# Patient Record
Sex: Female | Born: 1970 | Race: Black or African American | Hispanic: No | Marital: Single | Smoking: Smoker, current status unknown
Health system: Southern US, Community
[De-identification: ages and names within clinical notes are randomized; demographics above are authoritative.]

## PROBLEM LIST (undated history)

## (undated) DIAGNOSIS — R51 Headache: Secondary | ICD-10-CM

## (undated) DIAGNOSIS — I10 Essential (primary) hypertension: Secondary | ICD-10-CM

## (undated) DIAGNOSIS — R519 Headache, unspecified: Secondary | ICD-10-CM

## (undated) HISTORY — PX: BRAIN SURGERY: SHX531

---

## 2015-10-06 ENCOUNTER — Inpatient Hospital Stay (HOSPITAL_COMMUNITY): Payer: Medicare Other

## 2015-10-06 ENCOUNTER — Inpatient Hospital Stay (HOSPITAL_COMMUNITY)
Admission: EM | Admit: 2015-10-06 | Discharge: 2015-10-13 | DRG: 207 | Disposition: A | Discharge status: Home / Self Care | Payer: Medicare Other | Source: Other Acute Inpatient Hospital | Attending: Internal Medicine | Admitting: Internal Medicine

## 2015-10-06 DIAGNOSIS — A047 Enterocolitis due to Clostridium difficile: Secondary | ICD-10-CM | POA: Diagnosis not present

## 2015-10-06 DIAGNOSIS — Z87820 Personal history of traumatic brain injury: Secondary | ICD-10-CM | POA: Diagnosis not present

## 2015-10-06 DIAGNOSIS — J9601 Acute respiratory failure with hypoxia: Secondary | ICD-10-CM | POA: Diagnosis present

## 2015-10-06 DIAGNOSIS — N179 Acute kidney failure, unspecified: Secondary | ICD-10-CM | POA: Diagnosis not present

## 2015-10-06 DIAGNOSIS — K7460 Unspecified cirrhosis of liver: Secondary | ICD-10-CM | POA: Diagnosis present

## 2015-10-06 DIAGNOSIS — J810 Acute pulmonary edema: Secondary | ICD-10-CM

## 2015-10-06 DIAGNOSIS — S069X9A Unspecified intracranial injury with loss of consciousness of unspecified duration, initial encounter: Secondary | ICD-10-CM | POA: Diagnosis present

## 2015-10-06 DIAGNOSIS — R109 Unspecified abdominal pain: Secondary | ICD-10-CM

## 2015-10-06 DIAGNOSIS — Z9884 Bariatric surgery status: Secondary | ICD-10-CM

## 2015-10-06 DIAGNOSIS — R4182 Altered mental status, unspecified: Secondary | ICD-10-CM | POA: Insufficient documentation

## 2015-10-06 DIAGNOSIS — M549 Dorsalgia, unspecified: Secondary | ICD-10-CM | POA: Diagnosis not present

## 2015-10-06 DIAGNOSIS — X58XXXA Exposure to other specified factors, initial encounter: Secondary | ICD-10-CM | POA: Diagnosis not present

## 2015-10-06 DIAGNOSIS — J1000 Influenza due to other identified influenza virus with unspecified type of pneumonia: Secondary | ICD-10-CM | POA: Diagnosis present

## 2015-10-06 DIAGNOSIS — M545 Low back pain: Secondary | ICD-10-CM | POA: Diagnosis present

## 2015-10-06 DIAGNOSIS — R4020 Unspecified coma: Secondary | ICD-10-CM | POA: Diagnosis not present

## 2015-10-06 DIAGNOSIS — T40601A Poisoning by unspecified narcotics, accidental (unintentional), initial encounter: Secondary | ICD-10-CM | POA: Diagnosis not present

## 2015-10-06 DIAGNOSIS — IMO0002 Reserved for concepts with insufficient information to code with codable children: Secondary | ICD-10-CM

## 2015-10-06 DIAGNOSIS — F1110 Opioid abuse, uncomplicated: Secondary | ICD-10-CM | POA: Diagnosis not present

## 2015-10-06 DIAGNOSIS — F39 Unspecified mood [affective] disorder: Secondary | ICD-10-CM | POA: Diagnosis present

## 2015-10-06 DIAGNOSIS — F17210 Nicotine dependence, cigarettes, uncomplicated: Secondary | ICD-10-CM | POA: Diagnosis present

## 2015-10-06 DIAGNOSIS — E875 Hyperkalemia: Secondary | ICD-10-CM | POA: Diagnosis not present

## 2015-10-06 DIAGNOSIS — G9340 Encephalopathy, unspecified: Secondary | ICD-10-CM | POA: Diagnosis present

## 2015-10-06 DIAGNOSIS — Z79899 Other long term (current) drug therapy: Secondary | ICD-10-CM | POA: Diagnosis not present

## 2015-10-06 DIAGNOSIS — G9389 Other specified disorders of brain: Secondary | ICD-10-CM | POA: Diagnosis present

## 2015-10-06 DIAGNOSIS — G8929 Other chronic pain: Secondary | ICD-10-CM | POA: Diagnosis present

## 2015-10-06 DIAGNOSIS — Z4659 Encounter for fitting and adjustment of other gastrointestinal appliance and device: Secondary | ICD-10-CM

## 2015-10-06 DIAGNOSIS — Z789 Other specified health status: Secondary | ICD-10-CM

## 2015-10-06 DIAGNOSIS — R0600 Dyspnea, unspecified: Secondary | ICD-10-CM | POA: Diagnosis not present

## 2015-10-06 DIAGNOSIS — Y95 Nosocomial condition: Secondary | ICD-10-CM | POA: Diagnosis not present

## 2015-10-06 DIAGNOSIS — I110 Hypertensive heart disease with heart failure: Secondary | ICD-10-CM | POA: Diagnosis not present

## 2015-10-06 DIAGNOSIS — I252 Old myocardial infarction: Secondary | ICD-10-CM | POA: Diagnosis not present

## 2015-10-06 DIAGNOSIS — I5033 Acute on chronic diastolic (congestive) heart failure: Secondary | ICD-10-CM | POA: Diagnosis not present

## 2015-10-06 DIAGNOSIS — S0095XA Superficial foreign body of unspecified part of head, initial encounter: Secondary | ICD-10-CM

## 2015-10-06 DIAGNOSIS — J189 Pneumonia, unspecified organism: Secondary | ICD-10-CM | POA: Diagnosis not present

## 2015-10-06 DIAGNOSIS — Z978 Presence of other specified devices: Secondary | ICD-10-CM

## 2015-10-06 DIAGNOSIS — J101 Influenza due to other identified influenza virus with other respiratory manifestations: Secondary | ICD-10-CM | POA: Diagnosis not present

## 2015-10-06 DIAGNOSIS — T40601D Poisoning by unspecified narcotics, accidental (unintentional), subsequent encounter: Secondary | ICD-10-CM | POA: Diagnosis not present

## 2015-10-06 DIAGNOSIS — S069X9S Unspecified intracranial injury with loss of consciousness of unspecified duration, sequela: Secondary | ICD-10-CM | POA: Diagnosis not present

## 2015-10-06 DIAGNOSIS — J9690 Respiratory failure, unspecified, unspecified whether with hypoxia or hypercapnia: Secondary | ICD-10-CM

## 2015-10-06 HISTORY — DX: Headache: R51

## 2015-10-06 HISTORY — DX: Essential (primary) hypertension: I10

## 2015-10-06 HISTORY — DX: Headache, unspecified: R51.9

## 2015-10-06 LAB — POCT I-STAT 3, ART BLOOD GAS (G3+)
Acid-Base Excess: 3 mmol/L — ABNORMAL HIGH (ref 0.0–2.0)
Bicarbonate: 27.2 mEq/L — ABNORMAL HIGH (ref 20.0–24.0)
O2 Saturation: 96 %
PCO2 ART: 41.5 mmHg (ref 35.0–45.0)
PO2 ART: 80 mmHg (ref 80.0–100.0)
Patient temperature: 99.7
TCO2: 28 mmol/L (ref 0–100)
pH, Arterial: 7.427 (ref 7.350–7.450)

## 2015-10-06 LAB — GLUCOSE, CAPILLARY
GLUCOSE-CAPILLARY: 158 mg/dL — AB (ref 65–99)
GLUCOSE-CAPILLARY: 95 mg/dL (ref 65–99)
Glucose-Capillary: 92 mg/dL (ref 65–99)
Glucose-Capillary: 96 mg/dL (ref 65–99)
Glucose-Capillary: 104 mg/dL — ABNORMAL HIGH (ref 65–99)

## 2015-10-06 LAB — COMPREHENSIVE METABOLIC PANEL
ALT: 12 U/L — AB (ref 14–54)
AST: 24 U/L (ref 15–41)
Albumin: 2.4 g/dL — ABNORMAL LOW (ref 3.5–5.0)
Alkaline Phosphatase: 50 U/L (ref 38–126)
Anion gap: 14 (ref 5–15)
BUN: 39 mg/dL — AB (ref 6–20)
CHLORIDE: 104 mmol/L (ref 101–111)
CO2: 26 mmol/L (ref 22–32)
CREATININE: 1.41 mg/dL — AB (ref 0.44–1.00)
Calcium: 8 mg/dL — ABNORMAL LOW (ref 8.9–10.3)
GFR calc non Af Amer: 45 mL/min — ABNORMAL LOW (ref >60)
GFR, EST AFRICAN AMERICAN: 52 mL/min — AB (ref >60)
Glucose, Bld: 87 mg/dL (ref 65–99)
POTASSIUM: 3.9 mmol/L (ref 3.5–5.1)
SODIUM: 144 mmol/L (ref 135–145)
Total Bilirubin: 0.5 mg/dL (ref 0.3–1.2)
Total Protein: 6.8 g/dL (ref 6.5–8.1)

## 2015-10-06 LAB — CBC
HEMATOCRIT: 37.2 % (ref 36.0–46.0)
Hemoglobin: 12.5 g/dL (ref 12.0–15.0)
MCH: 32.6 pg (ref 26.0–34.0)
MCHC: 33.6 g/dL (ref 30.0–36.0)
MCV: 96.9 fL (ref 78.0–100.0)
PLATELETS: 223 10*3/uL (ref 150–400)
RBC: 3.84 MIL/uL — ABNORMAL LOW (ref 3.87–5.11)
RDW: 15.4 % (ref 11.5–15.5)
WBC: 7.6 10*3/uL (ref 4.0–10.5)

## 2015-10-06 LAB — URINALYSIS, ROUTINE W REFLEX MICROSCOPIC
BILIRUBIN URINE: NEGATIVE
Glucose, UA: NEGATIVE mg/dL
KETONES UR: NEGATIVE mg/dL
Leukocytes, UA: NEGATIVE
NITRITE: NEGATIVE
PROTEIN: NEGATIVE mg/dL
SPECIFIC GRAVITY, URINE: 1.014 (ref 1.005–1.030)
pH: 6 (ref 5.0–8.0)

## 2015-10-06 LAB — AMMONIA: Ammonia: 62 umol/L — ABNORMAL HIGH (ref 9–35)

## 2015-10-06 LAB — RAPID URINE DRUG SCREEN, HOSP PERFORMED
Amphetamines: NOT DETECTED
Barbiturates: NOT DETECTED
Benzodiazepines: POSITIVE — AB
COCAINE: NOT DETECTED
OPIATES: POSITIVE — AB
TETRAHYDROCANNABINOL: NOT DETECTED

## 2015-10-06 LAB — PROTIME-INR
INR: 1.07 (ref 0.00–1.49)
PROTHROMBIN TIME: 14.1 s (ref 11.6–15.2)

## 2015-10-06 LAB — BRAIN NATRIURETIC PEPTIDE: B NATRIURETIC PEPTIDE 5: 971.4 pg/mL — AB (ref 0.0–100.0)

## 2015-10-06 LAB — TRIGLYCERIDES: Triglycerides: 162 mg/dL — ABNORMAL HIGH (ref <150)

## 2015-10-06 LAB — PROCALCITONIN: Procalcitonin: 0.86 ng/mL

## 2015-10-06 LAB — MRSA PCR SCREENING: MRSA BY PCR: NEGATIVE

## 2015-10-06 LAB — INFLUENZA PANEL BY PCR (TYPE A & B)
H1N1FLUPCR: NOT DETECTED
INFLAPCR: NEGATIVE
INFLBPCR: NEGATIVE

## 2015-10-06 LAB — TROPONIN I
TROPONIN I: 0.09 ng/mL — AB (ref <0.031)
Troponin I: 0.05 ng/mL — ABNORMAL HIGH (ref <0.031)
Troponin I: 0.04 ng/mL — ABNORMAL HIGH (ref <0.031)

## 2015-10-06 LAB — LACTIC ACID, PLASMA
LACTIC ACID, VENOUS: 0.8 mmol/L (ref 0.5–2.0)
Lactic Acid, Venous: 0.7 mmol/L (ref 0.5–2.0)

## 2015-10-06 LAB — VALPROIC ACID LEVEL: VALPROIC ACID LVL: 43 ug/mL — AB (ref 50.0–100.0)

## 2015-10-06 LAB — URINE MICROSCOPIC-ADD ON

## 2015-10-06 LAB — SODIUM, URINE, RANDOM: SODIUM UR: 48 mmol/L

## 2015-10-06 LAB — PHOSPHORUS: Phosphorus: 2.7 mg/dL (ref 2.5–4.6)

## 2015-10-06 LAB — CK: Total CK: 172 U/L (ref 38–234)

## 2015-10-06 LAB — TSH: TSH: 0.794 u[IU]/mL (ref 0.350–4.500)

## 2015-10-06 MED ORDER — VECURONIUM BROMIDE 10 MG IV SOLR
10.0000 mg | Freq: Once | INTRAVENOUS | Status: AC
  Administered 2015-10-06: 10 mg via INTRAVENOUS

## 2015-10-06 MED ORDER — VITAL HIGH PROTEIN PO LIQD
1000.0000 mL | ORAL | Status: DC
  Administered 2015-10-06 – 2015-10-07 (×2): 1000 mL
  Administered 2015-10-08: 08:00:00
  Administered 2015-10-08: 1000 mL
  Filled 2015-10-06: qty 1000

## 2015-10-06 MED ORDER — METOPROLOL TARTRATE 1 MG/ML IV SOLN
INTRAVENOUS | Status: AC
  Filled 2015-10-06: qty 5

## 2015-10-06 MED ORDER — FAMOTIDINE IN NACL 20-0.9 MG/50ML-% IV SOLN
20.0000 mg | Freq: Two times a day (BID) | INTRAVENOUS | Status: DC
Start: 2015-10-06 — End: 2015-10-09
  Administered 2015-10-06 – 2015-10-09 (×7): 20 mg via INTRAVENOUS
  Filled 2015-10-06 (×10): qty 50

## 2015-10-06 MED ORDER — ASPIRIN 300 MG RE SUPP: 300.0000 mg | RECTAL | Status: AC

## 2015-10-06 MED ORDER — PANTOPRAZOLE SODIUM 40 MG IV SOLR: 40.0000 mg | Freq: Every day | INTRAVENOUS | Status: DC

## 2015-10-06 MED ORDER — FUROSEMIDE 10 MG/ML IJ SOLN: 40.0000 mg | Freq: Two times a day (BID) | INTRAMUSCULAR | Status: DC

## 2015-10-06 MED ORDER — PROPOFOL 1000 MG/100ML IV EMUL
0.0000 ug/kg/min | INTRAVENOUS | Status: DC
  Administered 2015-10-06: 20 ug/kg/min via INTRAVENOUS
  Administered 2015-10-06: 30 ug/kg/min via INTRAVENOUS
  Administered 2015-10-06 – 2015-10-07 (×2): 40 ug/kg/min via INTRAVENOUS
  Administered 2015-10-07: 35 ug/kg/min via INTRAVENOUS
  Filled 2015-10-06 (×6): qty 100

## 2015-10-06 MED ORDER — CHLORHEXIDINE GLUCONATE 0.12% ORAL RINSE (MEDLINE KIT)
15.0000 mL | Freq: Two times a day (BID) | OROMUCOSAL | Status: DC
Start: 2015-10-06 — End: 2015-10-09
  Administered 2015-10-06 – 2015-10-09 (×6): 15 mL via OROMUCOSAL

## 2015-10-06 MED ORDER — SODIUM CHLORIDE 0.9 % IV SOLN: 250.0000 mL | INTRAVENOUS | Status: DC | PRN

## 2015-10-06 MED ORDER — PROPOFOL 1000 MG/100ML IV EMUL
0.0000 ug/kg/min | INTRAVENOUS | Status: DC
  Administered 2015-10-06: 15 ug/kg/min via INTRAVENOUS

## 2015-10-06 MED ORDER — ANTISEPTIC ORAL RINSE SOLUTION (CORINZ): 7.0000 mL | Freq: Four times a day (QID) | OROMUCOSAL | Status: DC

## 2015-10-06 MED ORDER — GADOBENATE DIMEGLUMINE 529 MG/ML IV SOLN
20.0000 mL | Freq: Once | INTRAVENOUS | Status: AC | PRN
  Administered 2015-10-06: 20 mL via INTRAVENOUS

## 2015-10-06 MED ORDER — DOCUSATE SODIUM 100 MG PO CAPS
100.0000 mg | ORAL_CAPSULE | Freq: Two times a day (BID) | ORAL | Status: DC | PRN
  Filled 2015-10-06: qty 1

## 2015-10-06 MED ORDER — HEPARIN SODIUM (PORCINE) 5000 UNIT/ML IJ SOLN
5000.0000 [IU] | Freq: Three times a day (TID) | INTRAMUSCULAR | Status: DC
  Administered 2015-10-06 – 2015-10-13 (×22): 5000 [IU] via SUBCUTANEOUS
  Filled 2015-10-06 (×24): qty 1

## 2015-10-06 MED ORDER — ANTISEPTIC ORAL RINSE SOLUTION (CORINZ)
7.0000 mL | Freq: Four times a day (QID) | OROMUCOSAL | Status: DC
  Administered 2015-10-06 – 2015-10-09 (×12): 7 mL via OROMUCOSAL

## 2015-10-06 MED ORDER — PRO-STAT SUGAR FREE PO LIQD
30.0000 mL | Freq: Three times a day (TID) | ORAL | Status: DC
  Administered 2015-10-06 – 2015-10-09 (×8): 30 mL
  Filled 2015-10-06 (×12): qty 30

## 2015-10-06 MED ORDER — SODIUM CHLORIDE 0.9 % IV SOLN
25.0000 ug/h | INTRAVENOUS | Status: DC
  Administered 2015-10-06: 50 ug/h via INTRAVENOUS
  Administered 2015-10-07: 100 ug/h via INTRAVENOUS
  Administered 2015-10-08: 200 ug/h via INTRAVENOUS
  Filled 2015-10-06 (×7): qty 50

## 2015-10-06 MED ORDER — VECURONIUM BROMIDE 10 MG IV SOLR
INTRAVENOUS | Status: AC
  Filled 2015-10-06: qty 10

## 2015-10-06 MED ORDER — FENTANYL BOLUS VIA INFUSION
50.0000 ug | INTRAVENOUS | Status: DC | PRN
  Administered 2015-10-07 (×2): 50 ug via INTRAVENOUS
  Filled 2015-10-06: qty 50

## 2015-10-06 MED ORDER — CHLORHEXIDINE GLUCONATE 0.12% ORAL RINSE (MEDLINE KIT)
15.0000 mL | Freq: Two times a day (BID) | OROMUCOSAL | Status: DC
  Administered 2015-10-06: 15 mL via OROMUCOSAL

## 2015-10-06 MED ORDER — ADULT MULTIVITAMIN LIQUID CH
5.0000 mL | Freq: Every day | ORAL | Status: DC
  Administered 2015-10-06: 5 mL
  Filled 2015-10-06 (×3): qty 5

## 2015-10-06 MED ORDER — FENTANYL CITRATE (PF) 100 MCG/2ML IJ SOLN
50.0000 ug | Freq: Once | INTRAMUSCULAR | Status: AC
Start: 2015-10-06 — End: 2015-10-06

## 2015-10-06 MED ORDER — METOPROLOL TARTRATE 1 MG/ML IV SOLN
5.0000 mg | INTRAVENOUS | Status: DC | PRN
  Administered 2015-10-06 – 2015-10-09 (×5): 5 mg via INTRAVENOUS
  Filled 2015-10-06 (×6): qty 5

## 2015-10-06 MED ORDER — ASPIRIN 81 MG PO CHEW
324.0000 mg | CHEWABLE_TABLET | ORAL | Status: AC
  Administered 2015-10-06: 324 mg via ORAL
  Filled 2015-10-06: qty 4

## 2015-10-06 NOTE — Procedures (Signed)
   Current facility-administered medications:  .  0.9 %  sodium chloride infusion, 250 mL, Intravenous, PRN, Hillery Hunter Melancon, MD .  antiseptic oral rinse solution (CORINZ), 7 mL, Mouth Rinse, QID, Lupita Leash, MD, 7 mL at 10/06/15 1558 .  chlorhexidine gluconate (PERIDEX) 0.12 % solution 15 mL, 15 mL, Mouth Rinse, BID, Lupita Leash, MD, 15 mL at 10/06/15 2000 .  docusate sodium (COLACE) capsule 100 mg, 100 mg, Oral, BID PRN, Yolande Jolly, MD .  famotidine (PEPCID) IVPB 20 mg premix, 20 mg, Intravenous, Q12H, Lupita Leash, MD, 20 mg at 10/06/15 1015 .  feeding supplement (PRO-STAT SUGAR FREE 64) liquid 30 mL, 30 mL, Per Tube, TID, Idell Pickles, RD, 30 mL at 10/06/15 2022 .  feeding supplement (VITAL HIGH PROTEIN) liquid 1,000 mL, 1,000 mL, Per Tube, Q24H, Idell Pickles, RD, 1,000 mL at 10/06/15 1758 .  fentaNYL (SUBLIMAZE) 2,500 mcg in sodium chloride 0.9 % 250 mL (10 mcg/mL) infusion, 25-400 mcg/hr, Intravenous, Continuous, Shane Crutch, MD, Last Rate: 15 mL/hr at 10/06/15 2209, 150 mcg/hr at 10/06/15 2209 .  fentaNYL (SUBLIMAZE) bolus via infusion 50 mcg, 50 mcg, Intravenous, Q1H PRN, Shane Crutch, MD .  heparin injection 5,000 Units, 5,000 Units, Subcutaneous, 3 times per day, Yolande Jolly, MD, 5,000 Units at 10/06/15 2131 .  metoprolol (LOPRESSOR) injection 5 mg, 5 mg, Intravenous, Q3H PRN, Shane Crutch, MD, 5 mg at 10/06/15 1719 .  multivitamin liquid 5 mL, 5 mL, Per Tube, Daily, Idell Pickles, RD, 5 mL at 10/06/15 2021 .  propofol (DIPRIVAN) 1000 MG/100ML infusion, 0-50 mcg/kg/min, Intravenous, Continuous, Yolande Jolly, MD, Last Rate: 22.7 mL/hr at 10/06/15 2158, 40 mcg/kg/min at 10/06/15 2158  Introduction:  This is a 19 channel routine scalp EEG performed at the bedside with bipolar and monopolar montages arranged in accordance to the international 10/20 system of electrode placement. One channel was dedicated to EKG recording.     Findings:  The background rhythm is predominantly in the 6 Hz range , with occasional background activity reaching up to 8 Hz. Assymmetric slightly higher amplitudes were seen in the left posterior region likely breach artifact from a previously known left temporal craniotomy due to prior traumatic head injury . No definite evidence of abnormal epileptiform discharges or electrographic seizures were noted during this recording.   Impression:  This is an abnormal routine inpatient EEG suggestive of mild to moderate encephalopathy as described. No abnormal epileptiform Discharges or Electrographic Seizures Were Noted during This Recording .Clinical correlation is recommended .
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
## 2015-10-06 NOTE — Progress Notes (Signed)
*  PRELIMINARY RESULTS* Echocardiogram 2D Echocardiogram has been performed.  Emily Robles 10/06/2015, 8:35 AM

## 2015-10-06 NOTE — Progress Notes (Signed)
[facility] PCCM  Chart reviewed, discussed with resident  Briefly, 45 y/o female s/p RNY surgery, history of TBI with history of seizures and multiple recent admissions to [facility] for acute encephalopathy in the setting of hypoxemia, acute liver failure, and narcotic use was brought to an outside ER yesterday for confusion.  Intubated, brought here because no beds at [facility].  Filed Vitals:   10/06/15 0700 10/06/15 0745 10/06/15 0802 10/06/15 0815  BP: 152/88 149/91  148/92  Pulse: 87 89  91  Temp:   100 F (37.8 C)   TempSrc:   Oral   Resp: Height:      Weight:      SpO2: 96% 97%  97%     On exam Sedated on vent Lungs with wheezing bilaterally CV: RRR, no mgr GI: midline scar well healed, no masses, soft Derm: warm, no edema  CXR images personally reviewed > diffuse bilateral airspace disease in batwing pattern  Acute respiratory failure with hypoxemia> suspect pulmonary edema, ddx includes aspiration but no evidence of pneumonia or infection right now; plan adjust FiO2 for O2 saturation > 90%, continue sedation, will likely diurese but want to see renal studies first; check echo AKI> send renal urea/cr History of ?cirrhosis and acute liver injury> check AST/ALT now and ammonia Acute encephalopathy > MRI, EEG given history of seizures, propofol for sedation  Additional cc tie 40 minutes  Heber Climax, MD [place] PCCM Pager: [phone] Cell: [phone] After 3pm or if no response, call [phone]

## 2015-10-06 NOTE — Progress Notes (Signed)
EEG Completed; Results Pending  

## 2015-10-06 NOTE — Progress Notes (Signed)
UR Completed. Laisha Rau, RN, BSN.  [phone] 

## 2015-10-06 NOTE — Progress Notes (Signed)
eLink Physician-Brief Progress Note Patient Name: Emily Robles DOB: 1971-04-05 MRN: [id number]   Date of Service  10/06/2015  HPI/Events of Note  RN noted that BP increasing. Spoke with family at bedside, pt is on coreg and lisinopril at home, ACE being held due to elevated creatinine.   eICU Interventions  Will start lopressor prn.      Intervention Category Intermediate Interventions: Hypertension - evaluation and management  Shane Crutch 10/06/2015, 5:10 PM

## 2015-10-06 NOTE — Progress Notes (Signed)
Eyebrow ring removed, placed in specimen cup beside bed. Will inform family when they arrive.

## 2015-10-06 NOTE — Progress Notes (Signed)
Paralytic given per MD order, removed tongue ring with MD present at bedside. Jewelry placed on bedside table.
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
## 2015-10-07 ENCOUNTER — Encounter (HOSPITAL_COMMUNITY): Payer: Self-pay | Admitting: *Deleted

## 2015-10-07 ENCOUNTER — Inpatient Hospital Stay (HOSPITAL_COMMUNITY): Payer: Medicare Other

## 2015-10-07 DIAGNOSIS — J189 Pneumonia, unspecified organism: Secondary | ICD-10-CM

## 2015-10-07 DIAGNOSIS — T40601D Poisoning by unspecified narcotics, accidental (unintentional), subsequent encounter: Secondary | ICD-10-CM

## 2015-10-07 LAB — POCT I-STAT 3, ART BLOOD GAS (G3+)
ACID-BASE EXCESS: 6 mmol/L — AB (ref 0.0–2.0)
BICARBONATE: 32.7 meq/L — AB (ref 20.0–24.0)
O2 Saturation: 92 %
TCO2: 34 mmol/L (ref 0–100)
pCO2 arterial: 55.9 mmHg — ABNORMAL HIGH (ref 35.0–45.0)
pH, Arterial: 7.379 (ref 7.350–7.450)
pO2, Arterial: 71 mmHg — ABNORMAL LOW (ref 80.0–100.0)

## 2015-10-07 LAB — BASIC METABOLIC PANEL
Anion gap: 9 (ref 5–15)
BUN: 22 mg/dL — AB (ref 6–20)
CHLORIDE: 106 mmol/L (ref 101–111)
CO2: 30 mmol/L (ref 22–32)
CREATININE: 0.95 mg/dL (ref 0.44–1.00)
Calcium: 8.3 mg/dL — ABNORMAL LOW (ref 8.9–10.3)
GFR calc Af Amer: >60 mL/min (ref >60)
GFR calc non Af Amer: >60 mL/min (ref >60)
GLUCOSE: 99 mg/dL (ref 65–99)
Potassium: 3.9 mmol/L (ref 3.5–5.1)
SODIUM: 145 mmol/L (ref 135–145)

## 2015-10-07 LAB — CBC WITH DIFFERENTIAL/PLATELET
BASOS PCT: 1 %
Basophils Absolute: 0.1 10*3/uL (ref 0.0–0.1)
EOS PCT: 0 %
Eosinophils Absolute: 0 10*3/uL (ref 0.0–0.7)
HEMATOCRIT: 33.6 % — AB (ref 36.0–46.0)
HEMOGLOBIN: 11 g/dL — AB (ref 12.0–15.0)
LYMPHS PCT: 17 %
Lymphs Abs: 1.3 10*3/uL (ref 0.7–4.0)
MCH: 31.7 pg (ref 26.0–34.0)
MCHC: 32.7 g/dL (ref 30.0–36.0)
MCV: 96.8 fL (ref 78.0–100.0)
MONOS PCT: 17 %
Monocytes Absolute: 1.3 10*3/uL — ABNORMAL HIGH (ref 0.1–1.0)
NEUTROS ABS: 4.8 10*3/uL (ref 1.7–7.7)
NEUTROS PCT: 65 %
Platelets: 251 10*3/uL (ref 150–400)
RBC: 3.47 MIL/uL — ABNORMAL LOW (ref 3.87–5.11)
RDW: 15.6 % — ABNORMAL HIGH (ref 11.5–15.5)
WBC MORPHOLOGY: INCREASED
WBC: 7.5 10*3/uL (ref 4.0–10.5)

## 2015-10-07 LAB — RESPIRATORY VIRUS PANEL
Adenovirus: NEGATIVE
INFLUENZA A: POSITIVE — AB
INFLUENZA B 1: NEGATIVE
Metapneumovirus: NEGATIVE
PARAINFLUENZA 1 A: NEGATIVE
PARAINFLUENZA 2 A: NEGATIVE
Parainfluenza 3: NEGATIVE
RESPIRATORY SYNCYTIAL VIRUS B: NEGATIVE
Respiratory Syncytial Virus A: NEGATIVE
Rhinovirus: NEGATIVE

## 2015-10-07 LAB — MAGNESIUM: MAGNESIUM: 2.3 mg/dL (ref 1.7–2.4)

## 2015-10-07 LAB — GLUCOSE, CAPILLARY
GLUCOSE-CAPILLARY: 108 mg/dL — AB (ref 65–99)
GLUCOSE-CAPILLARY: 133 mg/dL — AB (ref 65–99)
GLUCOSE-CAPILLARY: 107 mg/dL — AB (ref 65–99)
Glucose-Capillary: 99 mg/dL (ref 65–99)
Glucose-Capillary: 112 mg/dL — ABNORMAL HIGH (ref 65–99)

## 2015-10-07 LAB — UREA NITROGEN, URINE: Urea Nitrogen, Ur: 651 mg/dL

## 2015-10-07 LAB — PROCALCITONIN: Procalcitonin: 0.38 ng/mL

## 2015-10-07 LAB — URINE CULTURE: Culture: NO GROWTH

## 2015-10-07 LAB — PHOSPHORUS: Phosphorus: 2.6 mg/dL (ref 2.5–4.6)

## 2015-10-07 MED ORDER — ADULT MULTIVITAMIN W/MINERALS CH
1.0000 | ORAL_TABLET | Freq: Every day | ORAL | Status: DC
Start: 2015-10-07 — End: 2015-10-13
  Administered 2015-10-07 – 2015-10-13 (×7): 1
  Filled 2015-10-07 (×12): qty 1

## 2015-10-07 MED ORDER — MIDAZOLAM HCL 2 MG/2ML IJ SOLN
2.0000 mg | INTRAMUSCULAR | Status: DC | PRN
  Administered 2015-10-07 – 2015-10-08 (×2): 2 mg via INTRAVENOUS
  Filled 2015-10-07: qty 2

## 2015-10-07 MED ORDER — LISINOPRIL 10 MG PO TABS
10.0000 mg | ORAL_TABLET | Freq: Every day | ORAL | Status: DC
Start: 2015-10-07 — End: 2015-10-08
  Administered 2015-10-07 – 2015-10-08 (×2): 10 mg via ORAL
  Filled 2015-10-07 (×3): qty 1

## 2015-10-07 MED ORDER — SODIUM CHLORIDE 0.9 % IV SOLN
1250.0000 mg | Freq: Two times a day (BID) | INTRAVENOUS | Status: DC
  Administered 2015-10-08 (×2): 1250 mg via INTRAVENOUS
  Filled 2015-10-07 (×4): qty 1250

## 2015-10-07 MED ORDER — MIDAZOLAM HCL 2 MG/2ML IJ SOLN
INTRAMUSCULAR | Status: AC
  Filled 2015-10-07: qty 2

## 2015-10-07 MED ORDER — MIDAZOLAM HCL 2 MG/2ML IJ SOLN
2.0000 mg | INTRAMUSCULAR | Status: DC | PRN
Start: 2015-10-07 — End: 2015-10-07

## 2015-10-07 MED ORDER — CARVEDILOL 6.25 MG PO TABS
6.2500 mg | ORAL_TABLET | Freq: Two times a day (BID) | ORAL | Status: DC
  Administered 2015-10-07 – 2015-10-08 (×2): 6.25 mg via ORAL
  Filled 2015-10-07 (×3): qty 1

## 2015-10-07 MED ORDER — VALPROATE SODIUM 500 MG/5ML IV SOLN
500.0000 mg | Freq: Three times a day (TID) | INTRAVENOUS | Status: DC
  Administered 2015-10-07 – 2015-10-12 (×15): 500 mg via INTRAVENOUS
  Filled 2015-10-07 (×18): qty 5

## 2015-10-07 MED ORDER — PIPERACILLIN-TAZOBACTAM 3.375 G IVPB
3.3750 g | Freq: Three times a day (TID) | INTRAVENOUS | Status: DC
  Administered 2015-10-07 – 2015-10-08 (×4): 3.375 g via INTRAVENOUS
  Filled 2015-10-07 (×6): qty 50

## 2015-10-07 MED ORDER — SODIUM CHLORIDE 0.9 % IV SOLN
1750.0000 mg | Freq: Once | INTRAVENOUS | Status: AC
  Administered 2015-10-07: 1750 mg via INTRAVENOUS
  Filled 2015-10-07: qty 1750

## 2015-10-07 MED ORDER — BISACODYL 10 MG RE SUPP: 10.0000 mg | Freq: Every day | RECTAL | Status: DC | PRN

## 2015-10-07 MED ORDER — FUROSEMIDE 10 MG/ML IJ SOLN
40.0000 mg | Freq: Four times a day (QID) | INTRAMUSCULAR | Status: AC
  Administered 2015-10-07 (×2): 40 mg via INTRAVENOUS
  Filled 2015-10-07 (×2): qty 4

## 2015-10-07 MED ORDER — DOCUSATE SODIUM 50 MG/5ML PO LIQD: 100.0000 mg | Freq: Two times a day (BID) | ORAL | Status: DC | PRN

## 2015-10-07 MED FILL — Midazolam HCl Inj 5 MG/5ML (Base Equivalent): INTRAMUSCULAR | Qty: 5 | Status: AC

## 2015-10-07 MED FILL — Morphine Sulfate Inj 10 MG/ML: INTRAMUSCULAR | Qty: 1 | Status: AC

## 2015-10-07 NOTE — Care Management Note (Addendum)
Case Management Note  Patient Details  Name: Haydee Jabbour MRN: [id number] Date of Birth: 12/04/1970  Subjective/Objective:     Pt admitted with altered mental status - possible from pain medications for chronic pain             Action/Plan:   CM was able to speak with pts mom and daughter - pt remains ventilated.  PTA pt was independent - mom informed CM that she doses pain meds to daughter - pt could benefit from [facility] for medication management, physician sticky note entered  Per bedside nurse pt is from home with daughter and mother.  Pt is from [place].  CM attempted to contact mother however no answer, left voicemail twice on 10/07/15.  CM will continue to attempt to contact parent.  Pt is on ventilator - per bedside; there hasn't been family at bedside   Expected Discharge Date:                  Expected Discharge Plan:  Home w Home Health Services  In-House Referral:     Discharge planning Services  CM Consult  Post Acute Care Choice:    Choice offered to:     DME Arranged:    DME Agency:     HH Arranged:    HH Agency:     Status of Service:  In process, will continue to follow  Medicare Important Message Given:    Date Medicare IM Given:    Medicare IM give by:    Date Additional Medicare IM Given:    Additional Medicare Important Message give by:     If discussed at Long Length of Stay Meetings, dates discussed:    Additional Comments:  Cherylann Parr, RN 10/07/2015, 11:04 AM

## 2015-10-07 NOTE — Progress Notes (Signed)
PULMONARY / CRITICAL CARE MEDICINE   Name: Emily Robles MRN: [id number] DOB: 1971/05/07    ADMISSION DATE:  10/06/2015 CONSULTATION DATE:  10/06/2015  REFERRING MD:  Outside Hospital.   CHIEF COMPLAINT:  Altered Mental Status  BRIEF:   Emily Robles is a 45 y.o. female with a history of TBI, recurrent admissions for narcotic overdose, possibly cirrhosis and s/p RNY for morbid obesity admitted on 2/28 for acute respiratory failure with hypoxemia and acute encephalopathy in the setting of narcotic use.     SUBJECTIVE:  Following commands this morning EEG > no seizures MRI > old TBI, nothing acute Lots of endotracheal tube secretions   VITAL SIGNS: BP 149/78 mmHg  Pulse 83  Temp(Src) 99.2 F (37.3 C) (Oral)  Resp 18  Ht  (1.626 m)  Wt 92.8 kg (204 lb 9.4 oz)  BMI 35.10 kg/m2  SpO2 93%  HEMODYNAMICS:    VENTILATOR SETTINGS: Vent Mode:  [-] PRVC FiO2 (%):  [50 %-60 %] 50 % Set Rate:  [16 bmp] 16 bmp Vt Set:  [430 mL] 430 mL PEEP:  [5 cmH20] 5 cmH20 Plateau Pressure:  [20 cmH20-27 cmH20] 25 cmH20  INTAKE / OUTPUT: I/O last 3 completed shifts: In: 1214.8 [I.V.:773.8; Other:10; NG/GT:331; IV Piggyback:100] Out: 1750 [Urine:1750]   PHYSICAL EXAMINATION: General: comfortable on vent HENT: NCAT ETT in place PULM: Few crackles bilaterally, vent supported breaths CV: RRR, no mgr GI: BS+, soft, nontender,  Derm: no rash or skin breakdown Neuro: sedated but arouses to voice, nods head appropriately to questions, follows commands  LABS:  BMET  Recent Labs Lab 10/06/15 0851 10/07/15 0220  NA 144 145  K 3.9 3.9  CL 104 106  CO2 26 30  BUN 39* 22*  CREATININE 1.41* 0.95  GLUCOSE 87 99    Electrolytes  Recent Labs Lab 10/06/15 0851 10/07/15 0220  CALCIUM 8.0* 8.3*  MG  --  2.3  PHOS 2.7 2.6    CBC  Recent Labs Lab 10/06/15 0851 10/07/15 0220  WBC 7.6 7.5  HGB 12.5 11.0*  HCT 37.2 33.6*  PLT 223 251    Coag's  Recent Labs Lab  10/06/15 0851  INR 1.07    Sepsis Markers  Recent Labs Lab 10/06/15 0851 10/06/15 1157 10/07/15 0220  LATICACIDVEN 0.8 0.7  --   PROCALCITON 0.86  --  0.38    ABG  Recent Labs Lab 10/06/15 0523 10/07/15 0353  PHART 7.427 7.379  PCO2ART 41.5 55.9*  PO2ART 80.0 71.0*    Liver Enzymes  Recent Labs Lab 10/06/15 0851  AST 24  ALT 12*  ALKPHOS 50  BILITOT 0.5  ALBUMIN 2.4*    Cardiac Enzymes  Recent Labs Lab 10/06/15 0851 10/06/15 1457 10/06/15 2206  TROPONINI 0.09* 0.05* 0.04*    Glucose  Recent Labs Lab 10/06/15 0800 10/06/15 1151 10/06/15 1532 10/06/15 2022 10/07/15 0358 10/07/15 0754  GLUCAP 92 96 95 104* 99 108*    Imaging 2/28 MRI brain > multifocal encephalomalacia compatible with traumatic brain injury, 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  2/28 EEG > no seizures, consistent with encephalopathy  CULTURES: 2/28 Blood Cx x 2 2/28 Urine Cx  2/28 RVP  ANTIBIOTICS: 3/1 Vanc >  3/1 Zosyn >   SIGNIFICANT EVENTS: 10/06/15 Admitted with suspected unintentional narcotic overdose, and CHF exacerbation.   LINES/TUBES: ETT 2/28 >> PIV x 2 2/28 >> Foley 2/28 >>  DISCUSSION:  Pt. Is a 45 y/o F here with Hypoxic respiratory failure, suspected CHF exacerbation, and AMS 2/2 Narcotic Overdose. Significant PMH as above.   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  P:   Lasix x2 doses PSV now, then resume full vent support Start antibiotic coverage for pneumonia VAP prevention  CARDIOVASCULAR A:  Hx of NSTEMI 2016 History of Postpartum Cardiomyopathy, systolic function fully recovered  P:  Give lasix again today Tele Restart coreg and lisinopril  RENAL A:   AKI resolved P:    Monitor BMET and UOP Replace electrolytes as needed Continue lasix  GASTROINTESTINAL A:   Hx of Gastric Bypass Hx of Cirrhosis  GI prophylaxis Nutrition P:   Continue pantoprazole for stress ulcer prophylaxis Continue tube feedings  HEMATOLOGIC A:   No acute issues  VTE Prophylaxis P:  Monitor for bleeding  INFECTIOUS A:   HCAP P:   F/u cultures Start Vanc/zosyn today  ENDOCRINE A:   No known issues P:   SSI. Check TSH  NEUROLOGIC A:   AMS due to narcotic overdose Narcotic abuse TBI  Seizure disorder? P:   Sedation:  Propofol RASS goal: 0 to -1. Daily WUA. Holding all sedating meds, Gabapentin, Remeron, Oxycodone, Percocet, Depakote Restart valproate acid today  Family updated: None at bedside  Interdisciplinary Family Meeting v Palliative Care Meeting:  Due by: 3/6  My cc time 35 minutes  Heber [place], MD [place] PCCM Pager: [phone] Cell: [phone] After 3pm or if no response, call [phone]

## 2015-10-07 NOTE — Clinical Documentation Improvement (Signed)
Critical Care  Can the diagnosis of CHF be further specified?    Acuity - Acute, Chronic, Acute on Chronic   Type - Systolic, Diastolic, Systolic and Diastolic  Other  Clinically Undetermined   Document any associated diagnoses/conditions   Supporting Information: Patient with a history of CHF per 02/28 progress notes. Suspected CHF exacerbation per 02/28 progress notes. 02/28: BNP: 971.4.   Please exercise your independent, professional judgment when responding. A specific answer is not anticipated or expected.   Thank Sabino Donovan Health Information Management [place] [phone]

## 2015-10-07 NOTE — Progress Notes (Signed)
eLink Physician-Brief Progress Note Patient Name: Emily Robles DOB: [birth year] MRN: [id number]   Date of Service  10/07/2015  HPI/Events of Note  Off propofol since this am, beginning to become more active. Fentanyl titrated up to 200/h. Will add low dose versed prn pushes  eICU Interventions       Intervention Category Minor Interventions: Agitation / anxiety - evaluation and management  Charlea Nardo S. 10/07/2015, 4:48 PM

## 2015-10-07 NOTE — Progress Notes (Signed)
Wasted Fentanyl 40cc with Ladora Daniel BSN.

## 2015-10-07 NOTE — Progress Notes (Signed)
Pt transferred to and from MRI with no complications.

## 2015-10-07 NOTE — Progress Notes (Signed)
Pt transferred to MRI without incident.  Will continue to monitor.

## 2015-10-07 NOTE — Progress Notes (Signed)
Pharmacy Antibiotic Note  Xyla Leisner is a 45 y.o. female admitted on 10/06/2015 with pneumonia.  Pharmacy has been consulted for vancomycin and Zosyn dosing.  Suspect HCAP with multiple recent admissions.  SCr improving, UOP 0.6 cc/kg/hr recorded.  CXR - infiltrates. Thick copious tan secretions noted.   Plan: Vancomycin  IV every  IV every 12 hours.  Goal trough 15-20 mcg/mL. Zosyn 3.375g IV q8h (4 hour infusion).  Monitor renal function, culture results, and clinical status.   Height:  (162.6 cm) Weight: 204 lb 9.4 oz (92.8 kg) IBW/kg (Calculated) : 54.7  Temp (24hrs), Avg:99.2 F (37.3 C), Min:98 F (36.7 C), Max:100.4 F (38 C)   Recent Labs Lab 10/06/15 0851 10/06/15 1157 10/07/15 0220  WBC 7.6  --  7.5  CREATININE 1.41*  --  0.95  LATICACIDVEN 0.8 0.7  --     Estimated Creatinine Clearance: 84.3 mL/min (by C-G formula based on Cr of 0.95).    Not on File  Antimicrobials this admission: Vancomycin 3/1 >> Zosyn 3/1 >>  Dose adjustments this admission: N/a  Microbiology results: 2/28 Blood >> 2/28 Urine >> 2/28 MRSA PCR negative 2/28 RVP - IP 2/28 Flu PCR - negative 2/28 Resp cx - no result  Thank you for allowing pharmacy to be a part of this patient's care.  Link Snuffer, PharmD, BCPS Clinical Pharmacist [phone]  10/07/2015 11:02 AM

## 2015-10-08 ENCOUNTER — Inpatient Hospital Stay (HOSPITAL_COMMUNITY): Payer: Medicare Other

## 2015-10-08 DIAGNOSIS — J101 Influenza due to other identified influenza virus with other respiratory manifestations: Secondary | ICD-10-CM

## 2015-10-08 DIAGNOSIS — I10 Essential (primary) hypertension: Secondary | ICD-10-CM

## 2015-10-08 LAB — CBC WITH DIFFERENTIAL/PLATELET
BASOS ABS: 0.1 10*3/uL (ref 0.0–0.1)
Basophils Relative: 1 %
EOS ABS: 0.1 10*3/uL (ref 0.0–0.7)
Eosinophils Relative: 1 %
HCT: 35.5 % — ABNORMAL LOW (ref 36.0–46.0)
Hemoglobin: 11.3 g/dL — ABNORMAL LOW (ref 12.0–15.0)
LYMPHS PCT: 21 %
Lymphs Abs: 1.8 10*3/uL (ref 0.7–4.0)
MCH: 31.7 pg (ref 26.0–34.0)
MCHC: 31.8 g/dL (ref 30.0–36.0)
MCV: 99.7 fL (ref 78.0–100.0)
MONO ABS: 1.8 10*3/uL — AB (ref 0.1–1.0)
Monocytes Relative: 21 %
Neutro Abs: 5 10*3/uL (ref 1.7–7.7)
Neutrophils Relative %: 56 %
PLATELETS: 256 10*3/uL (ref 150–400)
RBC: 3.56 MIL/uL — AB (ref 3.87–5.11)
RDW: 16 % — AB (ref 11.5–15.5)
WBC: 8.8 10*3/uL (ref 4.0–10.5)

## 2015-10-08 LAB — POCT I-STAT 3, ART BLOOD GAS (G3+)
ACID-BASE EXCESS: 13 mmol/L — AB (ref 0.0–2.0)
Bicarbonate: 36.4 mEq/L — ABNORMAL HIGH (ref 20.0–24.0)
O2 Saturation: 95 %
PH ART: 7.567 — AB (ref 7.350–7.450)
PO2 ART: 67 mmHg — AB (ref 80.0–100.0)
TCO2: 38 mmol/L (ref 0–100)
pCO2 arterial: 40 mmHg (ref 35.0–45.0)

## 2015-10-08 LAB — BASIC METABOLIC PANEL
ANION GAP: 9 (ref 5–15)
BUN: 16 mg/dL (ref 6–20)
CALCIUM: 8.3 mg/dL — AB (ref 8.9–10.3)
CO2: 32 mmol/L (ref 22–32)
Chloride: 105 mmol/L (ref 101–111)
Creatinine, Ser: 0.89 mg/dL (ref 0.44–1.00)
GFR calc Af Amer: >60 mL/min (ref >60)
GLUCOSE: 103 mg/dL — AB (ref 65–99)
Potassium: 3.6 mmol/L (ref 3.5–5.1)
Sodium: 146 mmol/L — ABNORMAL HIGH (ref 135–145)

## 2015-10-08 LAB — PROCALCITONIN: PROCALCITONIN: 0.21 ng/mL

## 2015-10-08 LAB — C DIFFICILE QUICK SCREEN W PCR REFLEX
C Diff antigen: POSITIVE — AB
C Diff toxin: NEGATIVE

## 2015-10-08 LAB — GLUCOSE, CAPILLARY
GLUCOSE-CAPILLARY: 113 mg/dL — AB (ref 65–99)
GLUCOSE-CAPILLARY: 119 mg/dL — AB (ref 65–99)
GLUCOSE-CAPILLARY: 121 mg/dL — AB (ref 65–99)
Glucose-Capillary: 109 mg/dL — ABNORMAL HIGH (ref 65–99)
Glucose-Capillary: 116 mg/dL — ABNORMAL HIGH (ref 65–99)
Glucose-Capillary: 102 mg/dL — ABNORMAL HIGH (ref 65–99)

## 2015-10-08 LAB — PATHOLOGIST SMEAR REVIEW

## 2015-10-08 MED ORDER — PIPERACILLIN-TAZOBACTAM 3.375 G IVPB
3.3750 g | Freq: Three times a day (TID) | INTRAVENOUS | Status: DC
  Administered 2015-10-08 – 2015-10-11 (×8): 3.375 g via INTRAVENOUS
  Filled 2015-10-08 (×10): qty 50

## 2015-10-08 MED ORDER — LISINOPRIL 20 MG PO TABS
20.0000 mg | ORAL_TABLET | Freq: Every day | ORAL | Status: DC
  Administered 2015-10-09 – 2015-10-11 (×3): 20 mg via ORAL
  Filled 2015-10-08 (×8): qty 1

## 2015-10-08 MED ORDER — OSELTAMIVIR PHOSPHATE 75 MG PO CAPS
75.0000 mg | ORAL_CAPSULE | Freq: Two times a day (BID) | ORAL | Status: DC
  Administered 2015-10-08 – 2015-10-09 (×3): 75 mg via ORAL
  Filled 2015-10-08 (×6): qty 1

## 2015-10-08 MED ORDER — FUROSEMIDE 10 MG/ML IJ SOLN
40.0000 mg | Freq: Four times a day (QID) | INTRAMUSCULAR | Status: AC
  Administered 2015-10-08 (×2): 40 mg via INTRAVENOUS
  Filled 2015-10-08 (×2): qty 4

## 2015-10-08 MED ORDER — POTASSIUM CHLORIDE 20 MEQ/15ML (10%) PO SOLN
40.0000 meq | Freq: Once | ORAL | Status: AC
  Administered 2015-10-08: 40 meq
  Filled 2015-10-08 (×2): qty 30

## 2015-10-08 MED ORDER — CARVEDILOL 12.5 MG PO TABS
12.5000 mg | ORAL_TABLET | Freq: Two times a day (BID) | ORAL | Status: DC
  Administered 2015-10-08 – 2015-10-13 (×10): 12.5 mg via ORAL
  Filled 2015-10-08 (×12): qty 1

## 2015-10-08 NOTE — Progress Notes (Signed)
Attempted to insert flexi-seal several times and with the assistance of another RN without success. Pt noted to become very agitated and would clench down while attempting insertion of flexi. A rectal pouch was placed in lieu of flexi-seal and is clean and dry at this time. Phill Myron RN

## 2015-10-08 NOTE — Progress Notes (Signed)
PULMONARY / CRITICAL CARE MEDICINE   Name: Emily Robles MRN: [id number] DOB: [birth year]    ADMISSION DATE:  10/06/2015 CONSULTATION DATE:  10/06/2015  REFERRING MD:  Outside Hospital.   CHIEF COMPLAINT:  Altered Mental Status  BRIEF:   Emily Robles is a 46 y.o. female with a history of TBI, recurrent admissions for narcotic overdose, possibly cirrhosis and s/p RNY for morbid obesity admitted on 2/28 for acute respiratory failure with hypoxemia and acute encephalopathy in the setting of narcotic use.     SUBJECTIVE:  Flu positive Fever Failed SBT due to low tidal volume, thick secretions   VITAL SIGNS: BP 168/112 mmHg  Pulse 83  Temp(Src) 101 F (38.3 C) (Oral)  Resp 25  Ht  (1.626 m)  Wt 95.6 kg (210 lb 12.2 oz)  BMI 36.16 kg/m2  SpO2 93%  HEMODYNAMICS:    VENTILATOR SETTINGS: Vent Mode:  [-] PSV;CPAP FiO2 (%):  [40 %-50 %] 40 % Set Rate:  [16 bmp] 16 bmp Vt Set:  [430 mL] 430 mL PEEP:  [5 cmH20] 5 cmH20 Pressure Support:  [10 cmH20-15 cmH20] 15 cmH20 Plateau Pressure:  [24 cmH20-27 cmH20] 24 cmH20  INTAKE / OUTPUT: I/O last 3 completed shifts: In: 3403.1 [I.V.:878.1; Other:130; NG/GT:1180; IV Piggyback:1215] Out: 4050 [Urine:4050]   PHYSICAL EXAMINATION: General: comfortable on vent HENT: NCAT ETT in place PULM: rhonchi bilaterally , vent supported breaths CV: RRR, no mgr GI: BS+, soft, nontender,  Derm: no rash or skin breakdown Neuro: sedated but arouses to voice, nods head appropriately to questions, follows commands  LABS:  BMET  Recent Labs Lab 10/06/15 0851 10/07/15 0220 10/08/15 0220  NA 144 145 146*  K 3.9 3.9 3.6  CL 104 106 105  CO2 26 30 32  BUN 39* 22* 16  CREATININE 1.41* 0.95 0.89  GLUCOSE 87 99 103*    Electrolytes  Recent Labs Lab 10/06/15 0851 10/07/15 0220 10/08/15 0220  CALCIUM 8.0* 8.3* 8.3*  MG  --  2.3  --   PHOS 2.7 2.6  --     CBC  Recent Labs Lab 10/06/15 0851 10/07/15 0220  10/08/15 0220  WBC 7.6 7.5 8.8  HGB 12.5 11.0* 11.3*  HCT 37.2 33.6* 35.5*  PLT 223 251 256    Coag's  Recent Labs Lab 10/06/15 0851  INR 1.07    Sepsis Markers  Recent Labs Lab 10/06/15 0851 10/06/15 1157 10/07/15 0220 10/08/15 0220  LATICACIDVEN 0.8 0.7  --   --   PROCALCITON 0.86  --  0.38 0.21    ABG  Recent Labs Lab 10/06/15 0523 10/07/15 0353 10/08/15 0449  PHART 7.427 7.379 7.567*  PCO2ART 41.5 55.9* 40.0  PO2ART 80.0 71.0* 67.0*    Liver Enzymes  Recent Labs Lab 10/06/15 0851  AST 24  ALT 12*  ALKPHOS 50  BILITOT 0.5  ALBUMIN 2.4*    Cardiac Enzymes  Recent Labs Lab 10/06/15 0851 10/06/15 1457 10/06/15 2206  TROPONINI 0.09* 0.05* 0.04*    Glucose  Recent Labs Lab 10/07/15 1548 10/07/15 1924 10/07/15 2334 10/08/15 0328 10/08/15 0817 10/08/15 1320  GLUCAP 133* 107* 119* 109* 113* 121*    Imaging 2/28 MRI brain > multifocal encephalomalacia compatible with traumatic brain injury, 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  2/28 EEG > no seizures, consistent with encephalopathy  CULTURES: 2/28 Blood Cx x 2 2/28 Urine Cx  2/28 RVP > Flu A  3/1 Resp culture > GNR  ANTIBIOTICS: 3/1 Vanc > 3/2 3/1 Zosyn >  3/2 Tamiflu >   SIGNIFICANT EVENTS: 10/06/15 Admitted with suspected unintentional narcotic overdose, and CHF exacerbation.  LINES/TUBES: ETT 2/28 >> PIV x 2 2/28 >> Foley 2/28 >>  DISCUSSION: Pt. Is a 45 y/o F here with Hypoxic respiratory failure, suspected CHF exacerbation, and AMS 2/2 Narcotic Overdose. Significant PMH as above.   ASSESSMENT / PLAN:  PULMONARY A: Hypoxic Respiratory Failure due influenza pneumonia and HCAP Limited by thick secretions, weak respirations P:   PSV daily See ID VAP prevention Pulmonary toilette  CARDIOVASCULAR A:  Hx of NSTEMI 2016 History of  Postpartum Cardiomyopathy, systolic function fully recovered  Hypertension P:  Tele Increase coreg and lisinopril Lasix again today  RENAL A:   No acute issues P:   Monitor BMET and UOP Replace electrolytes as needed  GASTROINTESTINAL A:   Hx of Gastric Bypass Hx of Cirrhosis  GI prophylaxis Nutrition P:   Continue pantoprazole for stress ulcer prophylaxis Continue tube feedings  HEMATOLOGIC A:   No acute issues  VTE Prophylaxis P:  Monitor for bleeding  INFECTIOUS A:   HCAP > GNR Influenza A pneumonia P:   F/u cultures Stop Vanc Continue zosyn  Start Tamiflu today  ENDOCRINE A:   No known issues P:   SSI. Check TSH  NEUROLOGIC A:   AMS due to narcotic overdose, flu Narcotic abuse TBI  Seizure disorder? P:   Sedation:  Fentanyl gtt RASS goal: 0 to -1 Daily WUA Holding all sedating meds, Gabapentin, Remeron, Oxycodone, Percocet,  Continue valproate acid home dose  Family updated: None at bedside, tried to call Mother with number listed on 3/2 but no answer, could not leave message  Interdisciplinary Family Meeting v Palliative Care Meeting:  Due by: 3/6  My cc time 37 minutes  Heber Weskan, MD [place] [facility] Pager: [phone] Cell: [phone] After 3pm or if no response, call [phone]

## 2015-10-09 LAB — CULTURE, RESPIRATORY

## 2015-10-09 LAB — BASIC METABOLIC PANEL
Anion gap: 9 (ref 5–15)
BUN: 17 mg/dL (ref 6–20)
CHLORIDE: 105 mmol/L (ref 101–111)
CO2: 35 mmol/L — ABNORMAL HIGH (ref 22–32)
Calcium: 8.7 mg/dL — ABNORMAL LOW (ref 8.9–10.3)
Creatinine, Ser: 0.92 mg/dL (ref 0.44–1.00)
GFR calc Af Amer: >60 mL/min (ref >60)
GFR calc non Af Amer: >60 mL/min (ref >60)
GLUCOSE: 113 mg/dL — AB (ref 65–99)
POTASSIUM: 3.8 mmol/L (ref 3.5–5.1)
Sodium: 149 mmol/L — ABNORMAL HIGH (ref 135–145)

## 2015-10-09 LAB — GLUCOSE, CAPILLARY
GLUCOSE-CAPILLARY: 108 mg/dL — AB (ref 65–99)
GLUCOSE-CAPILLARY: 119 mg/dL — AB (ref 65–99)
GLUCOSE-CAPILLARY: 144 mg/dL — AB (ref 65–99)
GLUCOSE-CAPILLARY: 149 mg/dL — AB (ref 65–99)
Glucose-Capillary: 111 mg/dL — ABNORMAL HIGH (ref 65–99)
Glucose-Capillary: 98 mg/dL (ref 65–99)

## 2015-10-09 LAB — CULTURE, RESPIRATORY W GRAM STAIN

## 2015-10-09 LAB — TRIGLYCERIDES: Triglycerides: 114 mg/dL (ref <150)

## 2015-10-09 MED ORDER — VITAL HIGH PROTEIN PO LIQD: 1000.0000 mL | ORAL | Status: DC

## 2015-10-09 MED ORDER — CETYLPYRIDINIUM CHLORIDE 0.05 % MT LIQD: 7.0000 mL | Freq: Two times a day (BID) | OROMUCOSAL | Status: DC

## 2015-10-09 MED ORDER — POTASSIUM CHLORIDE 20 MEQ/15ML (10%) PO SOLN
40.0000 meq | Freq: Once | ORAL | Status: AC
  Administered 2015-10-09: 40 meq via ORAL
  Filled 2015-10-09 (×2): qty 30

## 2015-10-09 MED ORDER — OSELTAMIVIR PHOSPHATE 6 MG/ML PO SUSR
75.0000 mg | Freq: Two times a day (BID) | ORAL | Status: DC
  Administered 2015-10-09 – 2015-10-12 (×6): 75 mg via ORAL
  Filled 2015-10-09 (×7): qty 12.5

## 2015-10-09 MED ORDER — FENTANYL CITRATE (PF) 100 MCG/2ML IJ SOLN
100.0000 ug | INTRAMUSCULAR | Status: DC | PRN
  Filled 2015-10-09: qty 2

## 2015-10-09 MED ORDER — FAMOTIDINE IN NACL 20-0.9 MG/50ML-% IV SOLN
20.0000 mg | Freq: Two times a day (BID) | INTRAVENOUS | Status: DC
  Administered 2015-10-10 (×2): 20 mg via INTRAVENOUS
  Filled 2015-10-09 (×3): qty 50

## 2015-10-09 MED ORDER — VITAL HIGH PROTEIN PO LIQD
1000.0000 mL | ORAL | Status: DC
  Administered 2015-10-09: 1000 mL
  Filled 2015-10-09 (×3): qty 1000

## 2015-10-09 MED ORDER — FENTANYL BOLUS VIA INFUSION
50.0000 ug | INTRAVENOUS | Status: DC | PRN
  Administered 2015-10-09: 25 ug via INTRAVENOUS
  Administered 2015-10-11: 50 ug via INTRAVENOUS
  Filled 2015-10-09: qty 50

## 2015-10-09 MED ORDER — FUROSEMIDE 10 MG/ML IJ SOLN
40.0000 mg | Freq: Four times a day (QID) | INTRAMUSCULAR | Status: AC
  Administered 2015-10-09 (×2): 40 mg via INTRAVENOUS
  Filled 2015-10-09 (×2): qty 4

## 2015-10-09 MED ORDER — FENTANYL CITRATE (PF) 2500 MCG/50ML IJ SOLN
25.0000 ug/h | INTRAMUSCULAR | Status: DC
  Administered 2015-10-09 – 2015-10-10 (×2): 250 ug/h via INTRAVENOUS
  Filled 2015-10-09: qty 50

## 2015-10-09 MED ORDER — DEXAMETHASONE SODIUM PHOSPHATE 4 MG/ML IJ SOLN
4.0000 mg | Freq: Four times a day (QID) | INTRAMUSCULAR | Status: DC
  Administered 2015-10-09 – 2015-10-11 (×7): 4 mg via INTRAVENOUS
  Filled 2015-10-09 (×8): qty 1

## 2015-10-09 MED ORDER — FENTANYL CITRATE (PF) 100 MCG/2ML IJ SOLN: 12.5000 ug | INTRAMUSCULAR | Status: DC | PRN

## 2015-10-09 MED ORDER — ONDANSETRON HCL 4 MG/2ML IJ SOLN: 4.0000 mg | Freq: Four times a day (QID) | INTRAMUSCULAR | Status: DC | PRN

## 2015-10-09 MED ORDER — CHLORHEXIDINE GLUCONATE 0.12 % MT SOLN: 15.0000 mL | Freq: Two times a day (BID) | OROMUCOSAL | Status: DC

## 2015-10-09 MED ORDER — POTASSIUM CHLORIDE CRYS ER 20 MEQ PO TBCR: 40.0000 meq | EXTENDED_RELEASE_TABLET | Freq: Once | ORAL | Status: DC

## 2015-10-09 MED ORDER — FENTANYL CITRATE (PF) 100 MCG/2ML IJ SOLN: 100.0000 ug | INTRAMUSCULAR | Status: DC | PRN

## 2015-10-09 NOTE — Progress Notes (Signed)
Wasted 30cc fentanyl in sink with Stefani DamaKristina Kelley RN (fentanyl had expired, new bag hung).

## 2015-10-09 NOTE — Care Management Important Message (Signed)
Important Message  Patient Details  Name: Emily Robles MRN: [id number] Date of Birth: 10-21-1970   Medicare Important Message Given:  Yes    Cherylann ParrClaxton, Emiah Pellicano S, RN 10/09/2015, 4:48 PM

## 2015-10-09 NOTE — Progress Notes (Signed)
UR Completed. Jaydyn Bozzo, RN, BSN.  [phone] 

## 2015-10-09 NOTE — Progress Notes (Signed)
RT bag lavaged patient without any complications. RT was able to remove a moderate amount of tan, thick secretions. Patient remained stable throughout.

## 2015-10-09 NOTE — Progress Notes (Signed)
eLink Physician-Brief Progress Note Patient Name: Emily ChurchJennifer Pung DOB: December 01, 1970 MRN: [id number]   Date of Service  10/09/2015  HPI/Events of Note  Best Practice  eICU Interventions  Patient not extubated. Re-started H2 Blocker for stress ulcer propy     Intervention Category Intermediate Interventions: Best-practice therapies (e.g. DVT, beta blocker, etc.)  Dvora Buitron 10/09/2015, 11:42 PM
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
## 2015-10-09 NOTE — Progress Notes (Signed)
No cuff leak heard or detected from a volume loss on ventilator. MD notified and extubation order canceled. Pressure support increased to 10 since no extubation today.

## 2015-10-10 ENCOUNTER — Encounter (HOSPITAL_COMMUNITY): Payer: Self-pay | Admitting: Surgery

## 2015-10-10 ENCOUNTER — Inpatient Hospital Stay (HOSPITAL_COMMUNITY): Payer: Medicare Other

## 2015-10-10 DIAGNOSIS — J9601 Acute respiratory failure with hypoxia: Secondary | ICD-10-CM | POA: Insufficient documentation

## 2015-10-10 LAB — BLOOD GAS, ARTERIAL
ACID-BASE EXCESS: 13.4 mmol/L — AB (ref 0.0–2.0)
ACID-BASE EXCESS: 12.8 mmol/L — AB (ref 0.0–2.0)
Bicarbonate: 37.8 mEq/L — ABNORMAL HIGH (ref 20.0–24.0)
Bicarbonate: 37.4 mEq/L — ABNORMAL HIGH (ref 20.0–24.0)
DRAWN BY: 419771
Drawn by: 419771
FIO2: 0.4
FIO2: 0.4
MECHVT: 430 mL
MECHVT: 430 mL
O2 SAT: 89.7 %
O2 Saturation: 88 %
PATIENT TEMPERATURE: 98.6
PCO2 ART: 50.1 mmHg — AB (ref 35.0–45.0)
PEEP/CPAP: 5 cmH2O
PEEP/CPAP: 5 cmH2O
PH ART: 7.463 — AB (ref 7.350–7.450)
PO2 ART: 56.5 mmHg — AB (ref 80.0–100.0)
PO2 ART: 61.6 mmHg — AB (ref 80.0–100.0)
Patient temperature: 98.6
RATE: 16 resp/min
RATE: 16 resp/min
TCO2: 39.3 mmol/L (ref 0–100)
TCO2: 39 mmol/L (ref 0–100)
pCO2 arterial: 53 mmHg — ABNORMAL HIGH (ref 35.0–45.0)
pH, Arterial: 7.49 — ABNORMAL HIGH (ref 7.350–7.450)

## 2015-10-10 LAB — CBC
HCT: 39.2 % (ref 36.0–46.0)
HEMATOCRIT: 39.2 % (ref 36.0–46.0)
HEMOGLOBIN: 12.4 g/dL (ref 12.0–15.0)
Hemoglobin: 12.5 g/dL (ref 12.0–15.0)
MCH: 32.2 pg (ref 26.0–34.0)
MCH: 32.1 pg (ref 26.0–34.0)
MCHC: 31.9 g/dL (ref 30.0–36.0)
MCHC: 31.6 g/dL (ref 30.0–36.0)
MCV: 101 fL — AB (ref 78.0–100.0)
MCV: 101.6 fL — AB (ref 78.0–100.0)
PLATELETS: 353 10*3/uL (ref 150–400)
Platelets: 319 10*3/uL (ref 150–400)
RBC: 3.88 MIL/uL (ref 3.87–5.11)
RBC: 3.86 MIL/uL — ABNORMAL LOW (ref 3.87–5.11)
RDW: 16.8 % — AB (ref 11.5–15.5)
RDW: 16.5 % — ABNORMAL HIGH (ref 11.5–15.5)
WBC: 16.1 10*3/uL — AB (ref 4.0–10.5)
WBC: 16.9 10*3/uL — ABNORMAL HIGH (ref 4.0–10.5)

## 2015-10-10 LAB — PHOSPHORUS: PHOSPHORUS: 5.2 mg/dL — AB (ref 2.5–4.6)

## 2015-10-10 LAB — BASIC METABOLIC PANEL
Anion gap: 10 (ref 5–15)
BUN: 26 mg/dL — AB (ref 6–20)
CHLORIDE: 104 mmol/L (ref 101–111)
CO2: 38 mmol/L — AB (ref 22–32)
CREATININE: 0.97 mg/dL (ref 0.44–1.00)
Calcium: 8.6 mg/dL — ABNORMAL LOW (ref 8.9–10.3)
GFR calc Af Amer: >60 mL/min (ref >60)
GFR calc non Af Amer: >60 mL/min (ref >60)
GLUCOSE: 111 mg/dL — AB (ref 65–99)
Potassium: 4.3 mmol/L (ref 3.5–5.1)
Sodium: 152 mmol/L — ABNORMAL HIGH (ref 135–145)

## 2015-10-10 LAB — MAGNESIUM: Magnesium: 2.5 mg/dL — ABNORMAL HIGH (ref 1.7–2.4)

## 2015-10-10 MED ORDER — FAMOTIDINE 40 MG/5ML PO SUSR
20.0000 mg | Freq: Two times a day (BID) | ORAL | Status: DC
  Administered 2015-10-10 – 2015-10-12 (×4): 20 mg via ORAL
  Filled 2015-10-10 (×3): qty 2.5
  Filled 2015-10-10: qty 3
  Filled 2015-10-10: qty 2.5
  Filled 2015-10-10: qty 3
  Filled 2015-10-10: qty 2.5

## 2015-10-10 MED ORDER — WHITE PETROLATUM GEL
Status: AC
  Administered 2015-10-10: 0.2
  Filled 2015-10-10: qty 1

## 2015-10-10 MED ORDER — HYDRALAZINE HCL 20 MG/ML IJ SOLN
10.0000 mg | INTRAMUSCULAR | Status: DC | PRN
  Administered 2015-10-10 – 2015-10-11 (×2): 10 mg via INTRAVENOUS
  Filled 2015-10-10 (×2): qty 1

## 2015-10-10 NOTE — Progress Notes (Signed)
eLink Physician-Brief Progress Note Patient Name: Emily ChurchJennifer Delatorre DOB: Feb 17, 1971 MRN: [id number]   Date of Service  10/10/2015  HPI/Events of Note  Hypertension in the setting of bradycardia with current BP of 176/107 (127)  HR 61  eICU Interventions  Plan: PRN hydralazine     Intervention Category Intermediate Interventions: Hypertension - evaluation and management  DETERDING,ELIZABETH 10/10/2015, 4:01 AM

## 2015-10-10 NOTE — Progress Notes (Signed)
Pharmacy Antibiotic Note  [age] YOF continues on Zosyn for Haemophilus influenzae PNA.  Patient's renal function has been stable.   Plan: - Continue Zosyn 3.375g IV Q8H, 4 hr infusion.  Consider narrowing to Ancef 2gm IV Q8H - Monitor renal fxn, clinical progress   Height: 5\' 4"  (162.6 cm) Weight: 204 lb 2.3 oz (92.6 kg) IBW/kg (Calculated) : 54.7  Temp (24hrs), Avg:99.4 F (37.4 C), Min:99 F (37.2 C), Max:99.8 F (37.7 C)   Recent Labs Lab 10/06/15 0851 10/06/15 1157 10/07/15 0220 10/08/15 0220 10/09/15 0240  WBC 7.6  --  7.5 8.8  --   CREATININE 1.41*  --  0.95 0.89 0.92  LATICACIDVEN 0.8 0.7  --   --   --     Estimated Creatinine Clearance: 87 mL/min (by C-G formula based on Cr of 0.92).    Not on File  Antimicrobials this admission: Vanc 3/1 >> 3/2 Zosyn 3/1 >> Tamiflu 3/2 >>  Dose adjustments this admission: N/A  Microbiology results: 2/28 Blood - NGTD 2/28 Urine - negative 2/28 RVP - Flu A+ 2/28 Flu PCR - negative 3/1 TA - abundant Haemophilus influenzae   Jaelynne Hockley D. Laney Potashang, PharmD, BCPS Pager:  [phone] 10/10/2015, 9:59 AM
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
## 2015-10-11 ENCOUNTER — Inpatient Hospital Stay (HOSPITAL_COMMUNITY): Payer: Medicare Other

## 2015-10-11 LAB — BASIC METABOLIC PANEL
ANION GAP: 10 (ref 5–15)
BUN: 33 mg/dL — ABNORMAL HIGH (ref 6–20)
CHLORIDE: 103 mmol/L (ref 101–111)
CO2: 33 mmol/L — AB (ref 22–32)
Calcium: 8.4 mg/dL — ABNORMAL LOW (ref 8.9–10.3)
Creatinine, Ser: 1.13 mg/dL — ABNORMAL HIGH (ref 0.44–1.00)
GFR calc Af Amer: >60 mL/min (ref >60)
GFR, EST NON AFRICAN AMERICAN: 59 mL/min — AB (ref >60)
GLUCOSE: 129 mg/dL — AB (ref 65–99)
POTASSIUM: 4.2 mmol/L (ref 3.5–5.1)
Sodium: 146 mmol/L — ABNORMAL HIGH (ref 135–145)

## 2015-10-11 LAB — CBC
HEMATOCRIT: 36.8 % (ref 36.0–46.0)
HEMOGLOBIN: 11.7 g/dL — AB (ref 12.0–15.0)
MCH: 32.1 pg (ref 26.0–34.0)
MCHC: 31.8 g/dL (ref 30.0–36.0)
MCV: 100.8 fL — AB (ref 78.0–100.0)
PLATELETS: 344 10*3/uL (ref 150–400)
RBC: 3.65 MIL/uL — AB (ref 3.87–5.11)
RDW: 16.6 % — ABNORMAL HIGH (ref 11.5–15.5)
WBC: 15.7 10*3/uL — AB (ref 4.0–10.5)

## 2015-10-11 LAB — CULTURE, BLOOD (ROUTINE X 2)
CULTURE: NO GROWTH
CULTURE: NO GROWTH

## 2015-10-11 LAB — DIGOXIN LEVEL: Digoxin Level: <0.2 ng/mL — ABNORMAL LOW (ref 0.8–2.0)

## 2015-10-11 MED ORDER — DEXTROSE 5 % IV SOLN
2.0000 g | INTRAVENOUS | Status: DC
  Administered 2015-10-11: 2 g via INTRAVENOUS
  Filled 2015-10-11 (×3): qty 2

## 2015-10-11 MED ORDER — IPRATROPIUM-ALBUTEROL 0.5-2.5 (3) MG/3ML IN SOLN
3.0000 mL | Freq: Four times a day (QID) | RESPIRATORY_TRACT | Status: DC
Start: 2015-10-11 — End: 2015-10-12
  Administered 2015-10-11: 3 mL via RESPIRATORY_TRACT
  Filled 2015-10-11 (×2): qty 3

## 2015-10-11 MED ORDER — DEXAMETHASONE SODIUM PHOSPHATE 4 MG/ML IJ SOLN
4.0000 mg | Freq: Two times a day (BID) | INTRAMUSCULAR | Status: DC
  Administered 2015-10-11 (×2): 4 mg via INTRAVENOUS
  Filled 2015-10-11 (×4): qty 1

## 2015-10-11 NOTE — Procedures (Signed)
Extubation Procedure Note  Patient Details:   Name: Emily ChurchJennifer Robles DOB: 10-Nov-1970 MRN: [id number]   Airway Documentation:     Evaluation  O2 sats: stable throughout Complications: No apparent complications Patient did tolerate procedure well. Bilateral Breath Sounds: Clear Suctioning: Airway Yes   Patient extubated to 3L nasal cannula per MD order.  Positive cuff leak was noted.  No evidence of stridor.  Patient able to speak post extubation.  Sats currently 100%.  Vitals are stable.  No apparent complications.   Durwin GlazeBrown, Latorsha Curling N 10/11/2015, 9:18 AM

## 2015-10-11 NOTE — Progress Notes (Signed)
PULMONARY / CRITICAL CARE MEDICINE   Name: Emily Robles MRN: [id number] DOB: 26-May-1971    ADMISSION DATE:  10/06/2015   CONSULTATION DATE: 10/06/2015  REFERRING MD: Outside Hospital.   CHIEF COMPLAINT: Altered Mental Status  BRIEF:  Emily Robles is a 45 y.o. female with a history of TBI, recurrent admissions for narcotic overdose, possibly cirrhosis and s/p RNY for morbid obesity admitted on 2/28 for acute respiratory failure with hypoxemia and acute encephalopathy in the setting of narcotic use.   SUBJECTIVE:  Did well on PST on 3/4. Doing PST now -- comfortable  VITAL SIGNS: BP 125/72 mmHg  Pulse 66  Temp(Src) 99.1 F (37.3 C) (Oral)  Resp 20  Ht  (1.626 m)  Wt 203 lb 11.3 oz (92.4 kg)  BMI 34.95 kg/m2  SpO2 98%  HEMODYNAMICS:    VENTILATOR SETTINGS: Vent Mode:  [-] PSV;CPAP FiO2 (%):  [40 %] 40 % Set Rate:  [16 bmp] 16 bmp Vt Set:  [430 mL] 430 mL PEEP:  [5 cmH20] 5 cmH20 Pressure Support:  [5 cmH20] 5 cmH20 Plateau Pressure:  [18 cmH20-20 cmH20] 19 cmH20  INTAKE / OUTPUT: I/O last 3 completed shifts: In: 3425 [I.V.:790; NG/GT:2015; IV Piggyback:620] Out: 1665 [Urine:1665]  PHYSICAL EXAMINATION: General: comfortable on vent HENT: NCAT ETT in place PULM: CTA B, vent supported breaths CV: RRR, no mgr GI: BS+, soft, nontender,  Derm: no rash or skin breakdown Neuro: awake, alert, following commands  LABS:  BMET  Recent Labs Lab 10/08/15 0220 10/09/15 0240 10/10/15 1422  NA 146* 149* 152*  K 3.6 3.8 4.3  CL 105 105 104  CO2 32 35* 38*  BUN 16 17 26*  CREATININE 0.89 0.92 0.97  GLUCOSE 103* 113* 111*    Electrolytes  Recent Labs Lab 10/06/15 0851 10/07/15 0220 10/08/15 0220 10/09/15 0240 10/10/15 1422  CALCIUM 8.0* 8.3* 8.3* 8.7* 8.6*  MG  --  2.3  --   --  2.5*  PHOS 2.7 2.6  --   --  5.2*    CBC  Recent Labs Lab 10/08/15 0220 10/10/15 1150 10/10/15 1422  WBC 8.8 16.1* 16.9*  HGB 11.3* 12.5 12.4  HCT  35.5* 39.2 39.2  PLT 256 353 319    Coag's  Recent Labs Lab 10/06/15 0851  INR 1.07    Sepsis Markers  Recent Labs Lab 10/06/15 0851 10/06/15 1157 10/07/15 0220 10/08/15 0220  LATICACIDVEN 0.8 0.7  --   --   PROCALCITON 0.86  --  0.38 0.21    ABG  Recent Labs Lab 10/08/15 0449 10/09/15 0400 10/10/15 0400  PHART 7.567* 7.490* 7.463*  PCO2ART 40.0 50.1* 53.0*  PO2ART 67.0* 56.5* 61.6*    Liver Enzymes  Recent Labs Lab 10/06/15 0851  AST 24  ALT 12*  ALKPHOS 50  BILITOT 0.5  ALBUMIN 2.4*    Cardiac Enzymes  Recent Labs Lab 10/06/15 0851 10/06/15 1457 10/06/15 2206  TROPONINI 0.09* 0.05* 0.04*    Glucose  Recent Labs Lab 10/09/15 0017 10/09/15 0408 10/09/15 0748 10/09/15 1147 10/09/15 1604 10/09/15 2031  GLUCAP 111* 98 108* 119* 144* 149*    Imaging No results found.  STUDIES:  10/06/2015 CXR - Pulmonary Vascular Congestion, Interstitial Infiltrate.  2/28 EEG > no seizures, consistent with encephalopathy  CULTURES: 2/28 Blood Cx x 2 2/28 Urine Cx  2/28 RVP > Flu A 3/1 Resp culture > GNR 3/2 C diff antigen positive, toxin negative  ANTIBIOTICS: 3/1 Vanc > 3/2 3/1 Zosyn >  3/2 Tamiflu >  SIGNIFICANT EVENTS: 10/06/15 Admitted with suspected unintentional narcotic overdose, and CHF exacerbation.  LINES/TUBES: ETT 2/28 >> PIV x 2 2/28 >> Foley 2/28 >>  DISCUSSION: Pt. Is a 45 y/o F here with Hypoxic respiratory failure with hypoxemia due to Influenza and HCAP. Significant PMH as above.   ASSESSMENT / PLAN:  PULMONARY A: Hypoxic Respiratory Failure due influenza pneumonia and HCAP and Pulm edema Presently with cuff leak P:  Doing well on PST. Anticipate extubate today. Has cuff leak.  Decadron IV >> taper off Cont zosyn >> plan for 7 days (has gram neg rods in sputum), cont tamiflu.  Pulmonary toilette  CARDIOVASCULAR A:  Hx of NSTEMI 2016 History of Postpartum Cardiomyopathy, systolic function fully  recovered ; EF now 55-60% Perhaps chronic diastolic heart failure but no evidence of acute CHF Hypertension P:  Tele Continue coreg and lisinopril Lasix again today once e OK  RENAL A:  No acute issues P:  Monitor BMET and UOP Replace electrolytes as needed  GASTROINTESTINAL A:  Hx of Gastric Bypass Hx of Cirrhosis  GI prophylaxis Nutrition P:  Continue pantoprazole for stress ulcer prophylaxis Continue tube feedings  HEMATOLOGIC A:  No acute issues  VTE Prophylaxis P:  Monitor for bleeding  INFECTIOUS A:  HCAP > GNR Influenza A pneumonia P:  Continue zosyn >> plan for 7 d Cont Tamiflu   ENDOCRINE A:  No known issues P:  SSI. Check TSH  NEUROLOGIC A:  AMS due to narcotic overdose, flu Narcotic abuse TBI  Seizure disorder? (records from [facility] suggested this) P:  Sedation: Fentanyl gtt off for PST RASS goal: 0 to -1 Holding all sedating meds, Gabapentin, Remeron, Oxycodone, Percocet Continue valproate acid home dose  No family at bedside.   My cc time 30 minutes   J. Alexis FrockAngelo A de Dios, MD Pulmonary and Critical Care Medicine [facility] Pager: [phone] After 3 pm or if no response, call [phone]  10/11/2015, 7:39 AM

## 2015-10-12 DIAGNOSIS — R4182 Altered mental status, unspecified: Secondary | ICD-10-CM

## 2015-10-12 DIAGNOSIS — E875 Hyperkalemia: Secondary | ICD-10-CM

## 2015-10-12 LAB — CBC
HCT: 41 % (ref 36.0–46.0)
Hemoglobin: 13.9 g/dL (ref 12.0–15.0)
MCH: 32.9 pg (ref 26.0–34.0)
MCHC: 33.9 g/dL (ref 30.0–36.0)
MCV: 96.9 fL (ref 78.0–100.0)
PLATELETS: 240 10*3/uL (ref 150–400)
RBC: 4.23 MIL/uL (ref 3.87–5.11)
RDW: 16 % — AB (ref 11.5–15.5)
WBC: 14.4 10*3/uL — AB (ref 4.0–10.5)

## 2015-10-12 LAB — BASIC METABOLIC PANEL
Anion gap: 11 (ref 5–15)
BUN: 21 mg/dL — AB (ref 6–20)
CALCIUM: 8.6 mg/dL — AB (ref 8.9–10.3)
CO2: 27 mmol/L (ref 22–32)
CREATININE: 0.83 mg/dL (ref 0.44–1.00)
Chloride: 104 mmol/L (ref 101–111)
GFR calc Af Amer: >60 mL/min (ref >60)
Glucose, Bld: 95 mg/dL (ref 65–99)
Potassium: 5.8 mmol/L — ABNORMAL HIGH (ref 3.5–5.1)
SODIUM: 142 mmol/L (ref 135–145)

## 2015-10-12 MED ORDER — DIVALPROEX SODIUM 125 MG PO CSDR
500.0000 mg | DELAYED_RELEASE_CAPSULE | Freq: Three times a day (TID) | ORAL | Status: DC
  Administered 2015-10-12 – 2015-10-13 (×2): 500 mg via ORAL
  Filled 2015-10-12 (×3): qty 4

## 2015-10-12 MED ORDER — SODIUM POLYSTYRENE SULFONATE 15 GM/60ML PO SUSP: 15.0000 g | Freq: Once | ORAL | Status: DC

## 2015-10-12 MED ORDER — ACETAMINOPHEN 325 MG PO TABS
650.0000 mg | ORAL_TABLET | Freq: Four times a day (QID) | ORAL | Status: DC | PRN
  Administered 2015-10-12 – 2015-10-13 (×4): 650 mg via ORAL
  Filled 2015-10-12 (×5): qty 2

## 2015-10-12 MED ORDER — OSELTAMIVIR PHOSPHATE 75 MG PO CAPS
75.0000 mg | ORAL_CAPSULE | Freq: Two times a day (BID) | ORAL | Status: AC
  Administered 2015-10-12: 75 mg via ORAL
  Filled 2015-10-12: qty 1

## 2015-10-12 MED ORDER — FAMOTIDINE 20 MG PO TABS
20.0000 mg | ORAL_TABLET | Freq: Two times a day (BID) | ORAL | Status: DC
  Administered 2015-10-12 – 2015-10-13 (×2): 20 mg via ORAL
  Filled 2015-10-12 (×3): qty 1

## 2015-10-12 NOTE — Progress Notes (Signed)
PULMONARY / CRITICAL CARE MEDICINE   Name: Emily Robles MRN: [id number] DOB: [birth year]    ADMISSION DATE:  10/06/2015   CONSULTATION DATE: 10/06/2015  REFERRING MD: Outside Hospital.   CHIEF COMPLAINT: Altered Mental Status  BRIEF:  Emily Robles is a 45 y.o. female with a history of TBI, recurrent admissions for narcotic overdose, possibly cirrhosis and s/p RNY for morbid obesity admitted on 2/28 for acute respiratory failure with hypoxemia and acute encephalopathy in the setting of narcotic use.   SUBJECTIVE:  Extubated and confused but following commands.  VITAL SIGNS: BP 125/101 mmHg  Pulse 72  Temp(Src) 97.9 F (36.6 C) (Oral)  Resp 12  Ht  (1.626 m)  Wt 93 kg (205 lb 0.4 oz)  BMI 35.18 kg/m2  SpO2 100%  HEMODYNAMICS:    VENTILATOR SETTINGS:    INTAKE / OUTPUT: I/O last 3 completed shifts: In: 2715 [P.O.:300; I.V.:640; NG/GT:1355; IV Piggyback:420] Out: 1770 [Urine:1770]  PHYSICAL EXAMINATION: General: Awake in chair, following commands. HENT: NCAT, PERRL, EOM-I and MMM. PULM: CTA B CV: RRR, no mgr GI: BS+, soft, nontender,  Derm: no rash or skin breakdown Neuro: awake, alert, following commands  LABS:  BMET  Recent Labs Lab 10/10/15 1422 10/11/15 0726 10/12/15 0301  NA 152* 146* 142  K 4.3 4.2 5.8*  CL 104 103 104  CO2 38* 33* 27  BUN 26* 33* 21*  CREATININE 0.97 1.13* 0.83  GLUCOSE 111* 129* 95    Electrolytes  Recent Labs Lab 10/06/15 0851 10/07/15 0220  10/10/15 1422 10/11/15 0726 10/12/15 0301  CALCIUM 8.0* 8.3*  < > 8.6* 8.4* 8.6*  MG  --  2.3  --  2.5*  --   --   PHOS 2.7 2.6  --  5.2*  --   --   < > = values in this interval not displayed.  CBC  Recent Labs Lab 10/10/15 1422 10/11/15 0726 10/12/15 0728  WBC 16.9* 15.7* 14.4*  HGB 12.4 11.7* 13.9  HCT 39.2 36.8 41.0  PLT 319 344 240   Coag's  Recent Labs Lab 10/06/15 0851  INR 1.07   Sepsis Markers  Recent Labs Lab 10/06/15 0851  10/06/15 1157 10/07/15 0220 10/08/15 0220  LATICACIDVEN 0.8 0.7  --   --   PROCALCITON 0.86  --  0.38 0.21   ABG  Recent Labs Lab 10/08/15 0449 10/09/15 0400 10/10/15 0400  PHART 7.567* 7.490* 7.463*  PCO2ART 40.0 50.1* 53.0*  PO2ART 67.0* 56.5* 61.6*   Liver Enzymes  Recent Labs Lab 10/06/15 0851  AST 24  ALT 12*  ALKPHOS 50  BILITOT 0.5  ALBUMIN 2.4*    Cardiac Enzymes  Recent Labs Lab 10/06/15 0851 10/06/15 1457 10/06/15 2206  TROPONINI 0.09* 0.05* 0.04*    Glucose  Recent Labs Lab 10/09/15 0017 10/09/15 0408 10/09/15 0748 10/09/15 1147 10/09/15 1604 10/09/15 2031  GLUCAP 111* 98 108* 119* 144* 149*    Imaging No results found.  STUDIES:  10/06/2015 CXR - Pulmonary Vascular Congestion, Interstitial Infiltrate.  2/28 EEG > no seizures, consistent with encephalopathy  CULTURES: 2/28 Blood Cx x 2 2/28 Urine Cx  2/28 RVP > Flu A 3/1 Resp culture > GNR 3/2 C diff antigen positive, toxin negative  ANTIBIOTICS: 3/1 Vanc > 3/2 3/1 Zosyn > 3/5 3/2 Tamiflu > 3/6 Rocephin 3/5>>>3/8  SIGNIFICANT EVENTS: 10/06/15 Admitted with suspected unintentional narcotic overdose, and CHF exacerbation.  LINES/TUBES: ETT 2/28 >>3/5 PIV x 2 2/28 >> Foley 2/28 >>  DISCUSSION: Pt. Is a  45 y/o F here with Hypoxic respiratory failure with hypoxemia due to Influenza and HCAP. Significant PMH as above.   I reviewed CXR myself,   ASSESSMENT / PLAN:  PULMONARY A: Hypoxic Respiratory Failure due influenza pneumonia and HCAP and Pulm edema Presently with cuff leak P:  Doing well on PST. Anticipate extubate today. Has cuff leak.  Decadron IV >> d/c Cont rocephin >> plan for 8 days (has gram neg rods in sputum), cont tamiflu for 5 days.  Pulmonary toilette.  CARDIOVASCULAR A:  Hx of NSTEMI 2016 History of Postpartum Cardiomyopathy, systolic function fully recovered ; EF now 55-60% Perhaps chronic diastolic heart failure but no evidence of acute  CHF Hypertension P:  Tele Continue coreg D/C lisinopril  D/C lasix.  RENAL A:  Hyperkalemia. P:  Monitor BMET and UOP Replace electrolytes as needed Kayexalate 15 gm X1. BMET in AM.  GASTROINTESTINAL A:  Hx of Gastric Bypass Hx of Cirrhosis  GI prophylaxis Nutrition P:  Continue pantoprazole for stress ulcer prophylaxis Continue tube feedings  HEMATOLOGIC A:  No acute issues  VTE Prophylaxis P:  Monitor for bleeding  INFECTIOUS A:  HCAP > GNR Influenza A pneumonia P:  Rocephin for total of 8 days. Cont Tamiflu til 3/6  ENDOCRINE A:  No known issues P:  SSI.  NEUROLOGIC A:  AMS due to narcotic overdose, flu Narcotic abuse TBI  Seizure disorder? (records from [facility] suggested this) P:  Sedation: Fentanyl gtt off for PST RASS goal: 0 to -1 Holding all sedating meds, Gabapentin, Remeron, Oxycodone, Percocet Continue valproate acid home dose  No family at bedside.   Discussed with [facility]-MD.  [facility]7.  Alyson ReedyWesam G. Yacoub, M.D. [facility] Pulmonary/Critical Care Medicine. Pager: [phone]. After hours pager: [phone]  10/12/2015, 12:07 PM

## 2015-10-13 ENCOUNTER — Inpatient Hospital Stay (HOSPITAL_COMMUNITY): Payer: Medicare Other

## 2015-10-13 DIAGNOSIS — S069X9S Unspecified intracranial injury with loss of consciousness of unspecified duration, sequela: Secondary | ICD-10-CM

## 2015-10-13 DIAGNOSIS — S069X9A Unspecified intracranial injury with loss of consciousness of unspecified duration, initial encounter: Secondary | ICD-10-CM | POA: Diagnosis present

## 2015-10-13 DIAGNOSIS — F39 Unspecified mood [affective] disorder: Secondary | ICD-10-CM

## 2015-10-13 DIAGNOSIS — X58XXXA Exposure to other specified factors, initial encounter: Secondary | ICD-10-CM

## 2015-10-13 LAB — BASIC METABOLIC PANEL
Anion gap: 9 (ref 5–15)
BUN: 17 mg/dL (ref 6–20)
CO2: 30 mmol/L (ref 22–32)
CREATININE: 0.86 mg/dL (ref 0.44–1.00)
Calcium: 8.7 mg/dL — ABNORMAL LOW (ref 8.9–10.3)
Chloride: 103 mmol/L (ref 101–111)
GFR calc Af Amer: >60 mL/min (ref >60)
GFR calc non Af Amer: >60 mL/min (ref >60)
GLUCOSE: 81 mg/dL (ref 65–99)
Potassium: 3.9 mmol/L (ref 3.5–5.1)
Sodium: 142 mmol/L (ref 135–145)

## 2015-10-13 LAB — CBC
HEMATOCRIT: 40.1 % (ref 36.0–46.0)
Hemoglobin: 13.4 g/dL (ref 12.0–15.0)
MCH: 32.8 pg (ref 26.0–34.0)
MCHC: 33.4 g/dL (ref 30.0–36.0)
MCV: 98.3 fL (ref 78.0–100.0)
PLATELETS: 268 10*3/uL (ref 150–400)
RBC: 4.08 MIL/uL (ref 3.87–5.11)
RDW: 15.9 % — AB (ref 11.5–15.5)
WBC: 11.6 10*3/uL — ABNORMAL HIGH (ref 4.0–10.5)

## 2015-10-13 LAB — PHOSPHORUS: Phosphorus: 3.9 mg/dL (ref 2.5–4.6)

## 2015-10-13 LAB — MAGNESIUM: MAGNESIUM: 2.1 mg/dL (ref 1.7–2.4)

## 2015-10-13 MED ORDER — NAPROXEN 500 MG PO TABS
500.0000 mg | ORAL_TABLET | Freq: Two times a day (BID) | ORAL | Status: DC
  Administered 2015-10-13 (×2): 500 mg via ORAL
  Filled 2015-10-13 (×3): qty 1

## 2015-10-13 MED ORDER — HALOPERIDOL LACTATE 5 MG/ML IJ SOLN
2.0000 mg | Freq: Once | INTRAMUSCULAR | Status: AC
  Administered 2015-10-13: 2 mg via INTRAMUSCULAR
  Filled 2015-10-13: qty 1

## 2015-10-13 MED ORDER — DIVALPROEX SODIUM 125 MG PO CSDR
250.0000 mg | DELAYED_RELEASE_CAPSULE | Freq: Three times a day (TID) | ORAL | Status: DC
  Filled 2015-10-13: qty 2

## 2015-10-13 MED ORDER — OXYCODONE HCL 5 MG PO TABS: 5.0000 mg | ORAL_TABLET | Freq: Four times a day (QID) | ORAL | Status: DC | PRN

## 2015-10-13 MED ORDER — CEPHALEXIN 500 MG PO CAPS: 500.0000 mg | ORAL_CAPSULE | Freq: Three times a day (TID) | ORAL | Status: AC

## 2015-10-13 MED ORDER — ENSURE ENLIVE PO LIQD: 237.0000 mL | Freq: Two times a day (BID) | ORAL | Status: AC

## 2015-10-13 MED ORDER — ENSURE ENLIVE PO LIQD
237.0000 mL | Freq: Two times a day (BID) | ORAL | Status: DC
  Administered 2015-10-13: 237 mL via ORAL

## 2015-10-13 MED ORDER — TRAMADOL HCL 50 MG PO TABS: 50.0000 mg | ORAL_TABLET | Freq: Two times a day (BID) | ORAL | Status: AC | PRN

## 2015-10-13 MED ORDER — GABAPENTIN 100 MG PO CAPS: 100.0000 mg | ORAL_CAPSULE | Freq: Three times a day (TID) | ORAL | Status: AC

## 2015-10-13 MED ORDER — TRAMADOL HCL 50 MG PO TABS
50.0000 mg | ORAL_TABLET | Freq: Two times a day (BID) | ORAL | Status: DC | PRN
  Administered 2015-10-13: 50 mg via ORAL
  Filled 2015-10-13: qty 1

## 2015-10-13 MED ORDER — CEPHALEXIN 500 MG PO CAPS
500.0000 mg | ORAL_CAPSULE | Freq: Three times a day (TID) | ORAL | Status: DC
  Administered 2015-10-13: 500 mg via ORAL
  Filled 2015-10-13: qty 1

## 2015-10-13 MED ORDER — OSELTAMIVIR PHOSPHATE 75 MG PO CAPS: 75.0000 mg | ORAL_CAPSULE | Freq: Two times a day (BID) | ORAL | Status: DC

## 2015-10-13 MED ORDER — DIVALPROEX SODIUM 500 MG PO DR TAB: 500.0000 mg | DELAYED_RELEASE_TABLET | Freq: Three times a day (TID) | ORAL | Status: AC

## 2015-10-13 MED ORDER — KETOROLAC TROMETHAMINE 30 MG/ML IJ SOLN: 30.0000 mg | Freq: Four times a day (QID) | INTRAMUSCULAR | Status: DC | PRN

## 2015-10-13 MED ORDER — FAMOTIDINE 20 MG PO TABS: 20.0000 mg | ORAL_TABLET | Freq: Every day | ORAL | Status: AC

## 2015-10-13 MED ORDER — NAPROXEN 500 MG PO TABS: 500.0000 mg | ORAL_TABLET | Freq: Two times a day (BID) | ORAL | Status: AC

## 2015-10-13 MED ORDER — DIVALPROEX SODIUM 500 MG PO DR TAB
500.0000 mg | DELAYED_RELEASE_TABLET | Freq: Three times a day (TID) | ORAL | Status: DC
  Administered 2015-10-13: 500 mg via ORAL
  Filled 2015-10-13 (×2): qty 1

## 2015-10-13 MED ORDER — QUETIAPINE FUMARATE 50 MG PO TABS: 100.0000 mg | ORAL_TABLET | Freq: Every day | ORAL | Status: DC

## 2015-10-13 MED ORDER — QUETIAPINE FUMARATE 100 MG PO TABS: 100.0000 mg | ORAL_TABLET | Freq: Every day | ORAL | Status: AC

## 2015-10-13 MED ORDER — CARVEDILOL 6.25 MG PO TABS: 12.5000 mg | ORAL_TABLET | Freq: Two times a day (BID) | ORAL | Status: AC

## 2015-10-13 MED ORDER — FUROSEMIDE 40 MG PO TABS
40.0000 mg | ORAL_TABLET | Freq: Every day | ORAL | Status: DC
  Administered 2015-10-13: 40 mg via ORAL
  Filled 2015-10-13: qty 1

## 2015-10-13 NOTE — Progress Notes (Signed)
Dr.Patel called and said patient can leave if her mother is here, called him when mother came to the unit.  Discontinued soft waist restraints as per order.  Discontinued Tele and notified CCMD.  Patient has no peripheral IV.  Patient discharged to home with mother.  Reviewed her medications and prescriptions and discharge instructions with the mother, understood and acknowledged.  Safety sitter took the patient out of the unit in w/c accompanied by her mother.  Patient took all her belongings with her.  Patient was very calm and peaceful while leaving the unit.

## 2015-10-13 NOTE — Plan of Care (Signed)
Problem: Education: Goal: Knowledge of [facility] Education information/materials will improve Outcome: Adequate for Discharge Pt's mom will be caring for her.
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
## 2015-10-13 NOTE — Progress Notes (Signed)
Pt came to nurses station in an attempt to leave unit. Dr. Allena KatzPatel meet pt in the hallway and explained to her the reasons she needed to stay. During the conversation pt continues to c/o abdominal pain. Dr. Allena KatzPatel asked pt to go back to her room so that he could get an xray of her stomach. Per Dr. Allena KatzPatel, he had been in contact with the pt's mother alerting her of his plan of involuntarily committing the pt. Pt would not go back to her room and continued to walk towards the exit door. Paged security; security escorted pt back to her room. New orders from Dr. Allena KatzPatel written and soft waist restraint applied. Pt lying in bed with safety sitter present. Pt's assigned nurse present during occurences and will continue to monitor pt.
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
## 2015-10-13 NOTE — Progress Notes (Signed)
Around 0230, patient insisted on taking a walk in the hallway. RN tried to explain that since patient is on precautions she can not leave the room. Patient still insisting, voicing "this is what the doctor wants me to do." RN ran this through charge RN and charge RN agreed. Around 0630, patient tried to get out of bed to walk in the hallway again. RN informed patient that at this time no one is able to supervise her while she walks. RN also told patient that she will be getting a sitter and to wait until then to walk. Patient grew angry and started charging towards nurse. Patient unhooked her rectal tube and foley catheter from the bed and kept insisting on leaving the room. Patient tried to grab walker and leave the room. Security was called and assisted patient to the bed. Patient was verbally abusive towards RN, uttering " you make me sick" and to "shut the fuck up". RN assured patient that a sitter will soon be with her and can assist her in her walks. RN will continue to monitor patient and inform oncoming RN and sitter.  Veatrice KellsMahmoud,Aerie Donica I, RN

## 2015-10-13 NOTE — Care Management Important Message (Signed)
Important Message  Patient Details  Name: Emily Robles MRN: [id number] Date of Birth: 1970/09/14   Medicare Important Message Given:  Yes    Jayel Inks, Annamarie MajorCheryl U, RN 10/13/2015, 12:57 PM

## 2015-10-13 NOTE — Progress Notes (Signed)
Patient attempted to leave the unit, had to call security two times to make the patient go to her room as she was resistant to go back to her room.  Dr.Patel informed about the situation, he was able to talk to the mother and explained to her about the situation.  He told the RN if patient does same thing once again inform him and will plan for IVC.  After an hour later patient started to do the same, came to the nurses station attempted to leave, Dr.patel met her in hallway and explained the reasons she needed to stay.  She c/o abdominal pain and also said that her frontal area of the head is hot.  He ordered US of abdomen and ordered some pain medicine.  After all the conversation, she still wanted to leave the unit, security called, they escorted her back to the room.  Dr. Posey Pronto put new order for soft waist restraint, CN and RN put the restraint and also given 24m Haldol per order.  Safety sitter present with the patient, checked after 30 mins, patient lying in bed asleep.   Will continue to monitor.

## 2015-10-14 NOTE — Discharge Summary (Signed)
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 NEGATIVE Final   C Diff toxin NEGATIVE NEGATIVE Final   C Diff interpretation   Final    C. difficile present, but toxin not detected. This indicates colonization. In most cases, this does not require treatment. If patient has signs and symptoms consistent with colitis, consider treatment. Requires ENTERIC precautions.     Labs: CBC:  Recent Labs Lab 10/08/15 0220 10/10/15 1150 10/10/15 1422 10/11/15 0726 10/12/15 0728 10/13/15 0651  WBC 8.8 16.1* 16.9* 15.7* 14.4* 11.6*  NEUTROABS 5.0  --   --   --   --   --   HGB 11.3* 12.5 12.4 11.7* 13.9 13.4  HCT 35.5* 39.2 39.2 36.8 41.0 40.1  MCV 99.7 101.0* 101.6* 100.8* 96.9 98.3  PLT 256 353 319 344 240 268   Basic Metabolic Panel:  Recent Labs Lab 10/09/15 0240 10/10/15 1422 10/11/15 0726 10/12/15 0301 10/13/15 0651  NA 149* 152* 146* 142 142  K 3.8 4.3 4.2 5.8* 3.9  CL 105 104 103 104 103  CO2 35* 38* 33* 27 30  GLUCOSE 113* 111* 129* 95 81  BUN 17 26* 33* 21* 17  CREATININE 0.92 0.97 1.13* 0.83 0.86  CALCIUM 8.7* 8.6*  8.4* 8.6* 8.7*  MG  --  2.5*  --   --  2.1  PHOS  --  5.2*  --   --  3.9   BNP (last 3 results)  Recent Labs  10/06/15 0851  BNP 971.4*   CBG:  Recent Labs Lab 10/09/15 0408 10/09/15 0748 10/09/15 1147 10/09/15 1604 10/09/15 2031  GLUCAP 98 108* 119* 144* 149*   Time spent: 30 minutes  Signed:  Rima Blizzard  [facility] 10/13/2015 , 8:25 AM

## 2016-11-25 IMAGING — DX DG CHEST 1V PORT
1 series · 1 of 1 positions shown · non-contrast
Comparison: 10/10/2015

CLINICAL DATA: Respiratory failure

EXAM:
PORTABLE CHEST 1 VIEW

[chest ap]
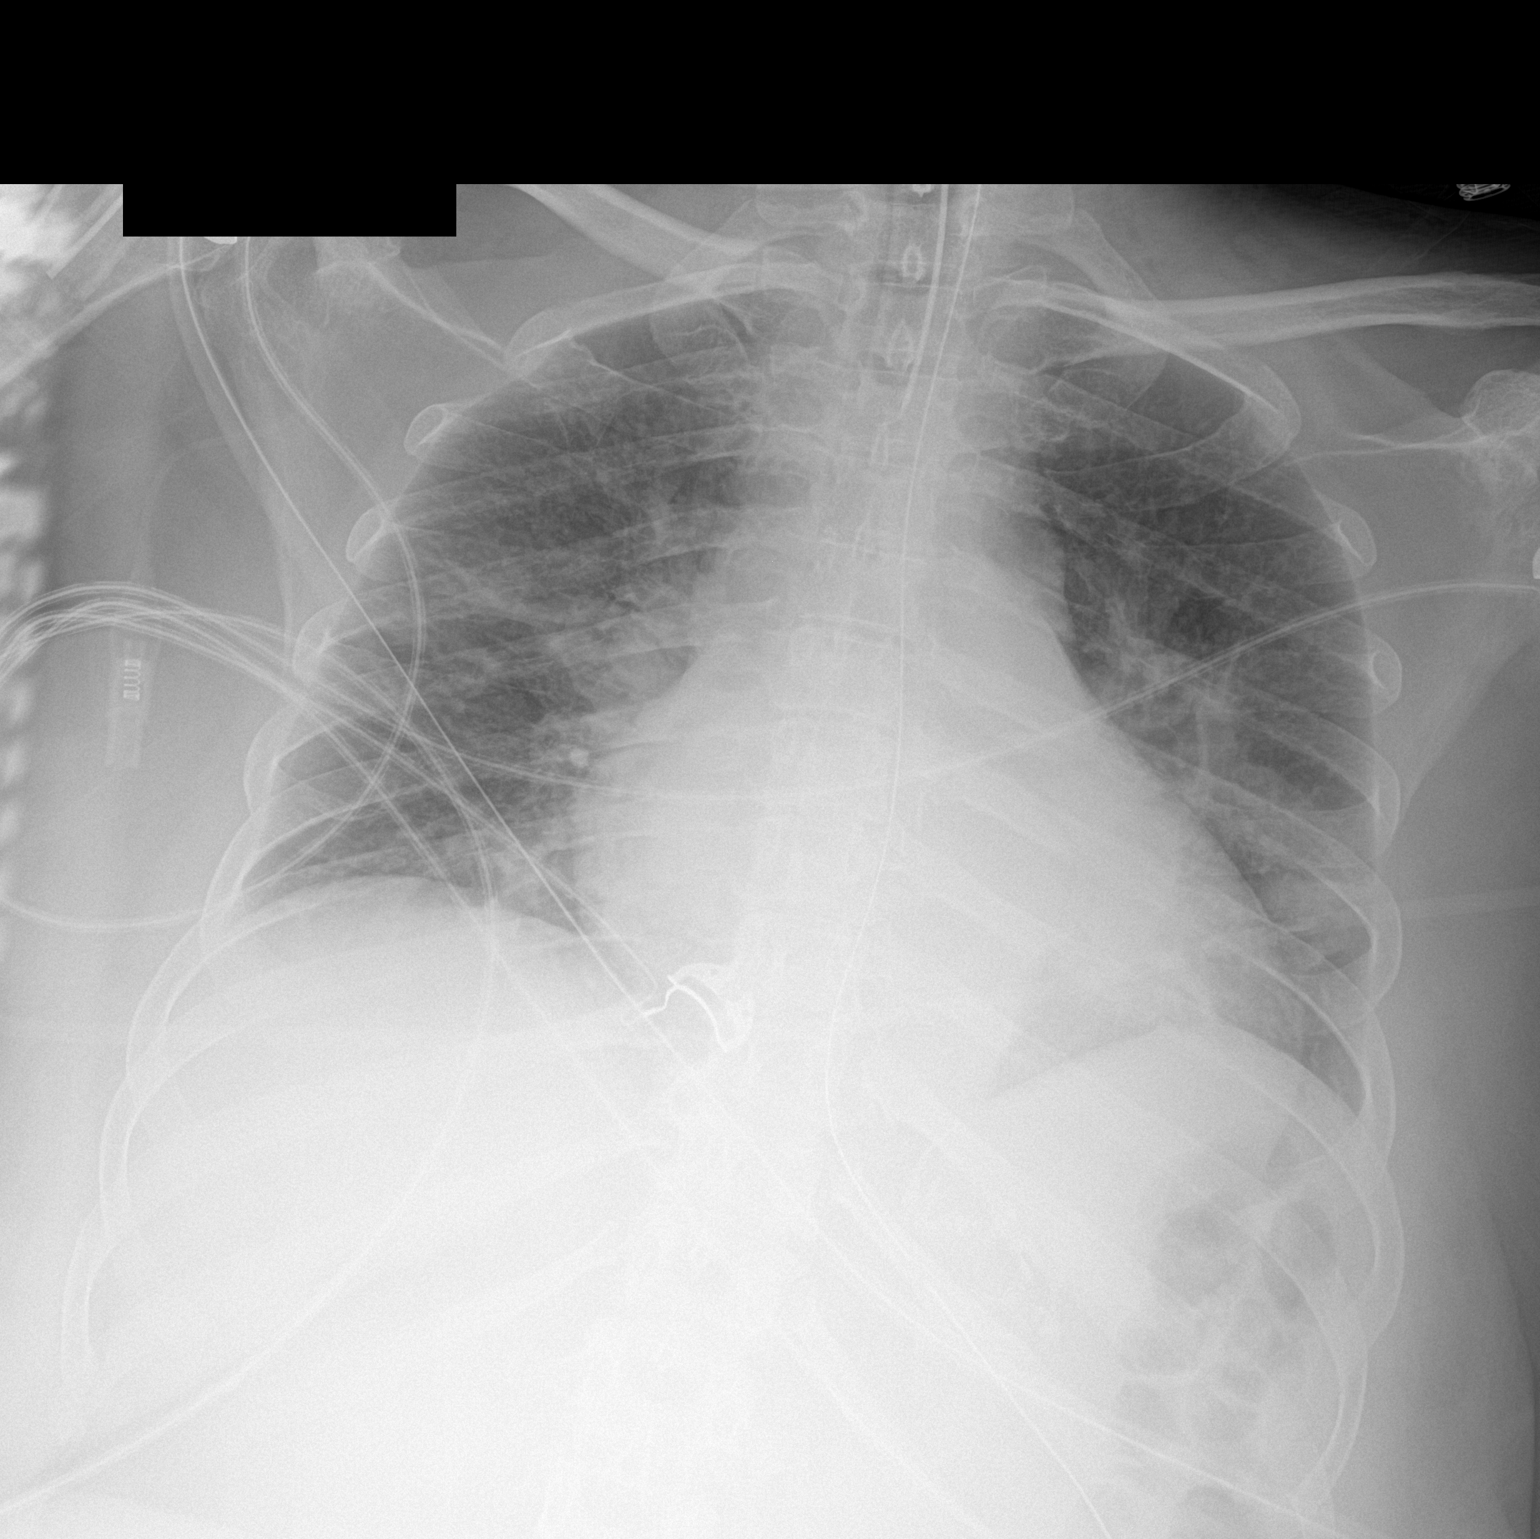

[1 of 1 positions shown; findings below may reference images not displayed]

FINDINGS: No change in the position of endotracheal tube or NG tube.

Stable moderate cardiac enlargement. Central vascular congestion
again identified. Mild diffuse interstitial prominence. Bilateral
perihilar infiltrates again identified. Right perihilar infiltrate
is mildly more prominent. Left perihilar infiltrate is mildly
improved.
IMPRESSION: Findings suggest congestive heart failure with pulmonary edema.
Infiltrate mildly more prominent on the right but mildly improved on
the left, both likely representing pulmonary edema although
pneumonia not entirely excluded.

## 2016-11-27 IMAGING — CR DG ABD PORTABLE 1V
1 series · 1 of 1 positions shown · non-contrast
Comparison: 10/06/2015

CLINICAL DATA: Lower abdominal pain, confusion, diarrhea

EXAM:
PORTABLE ABDOMEN - 1 VIEW

[AP]
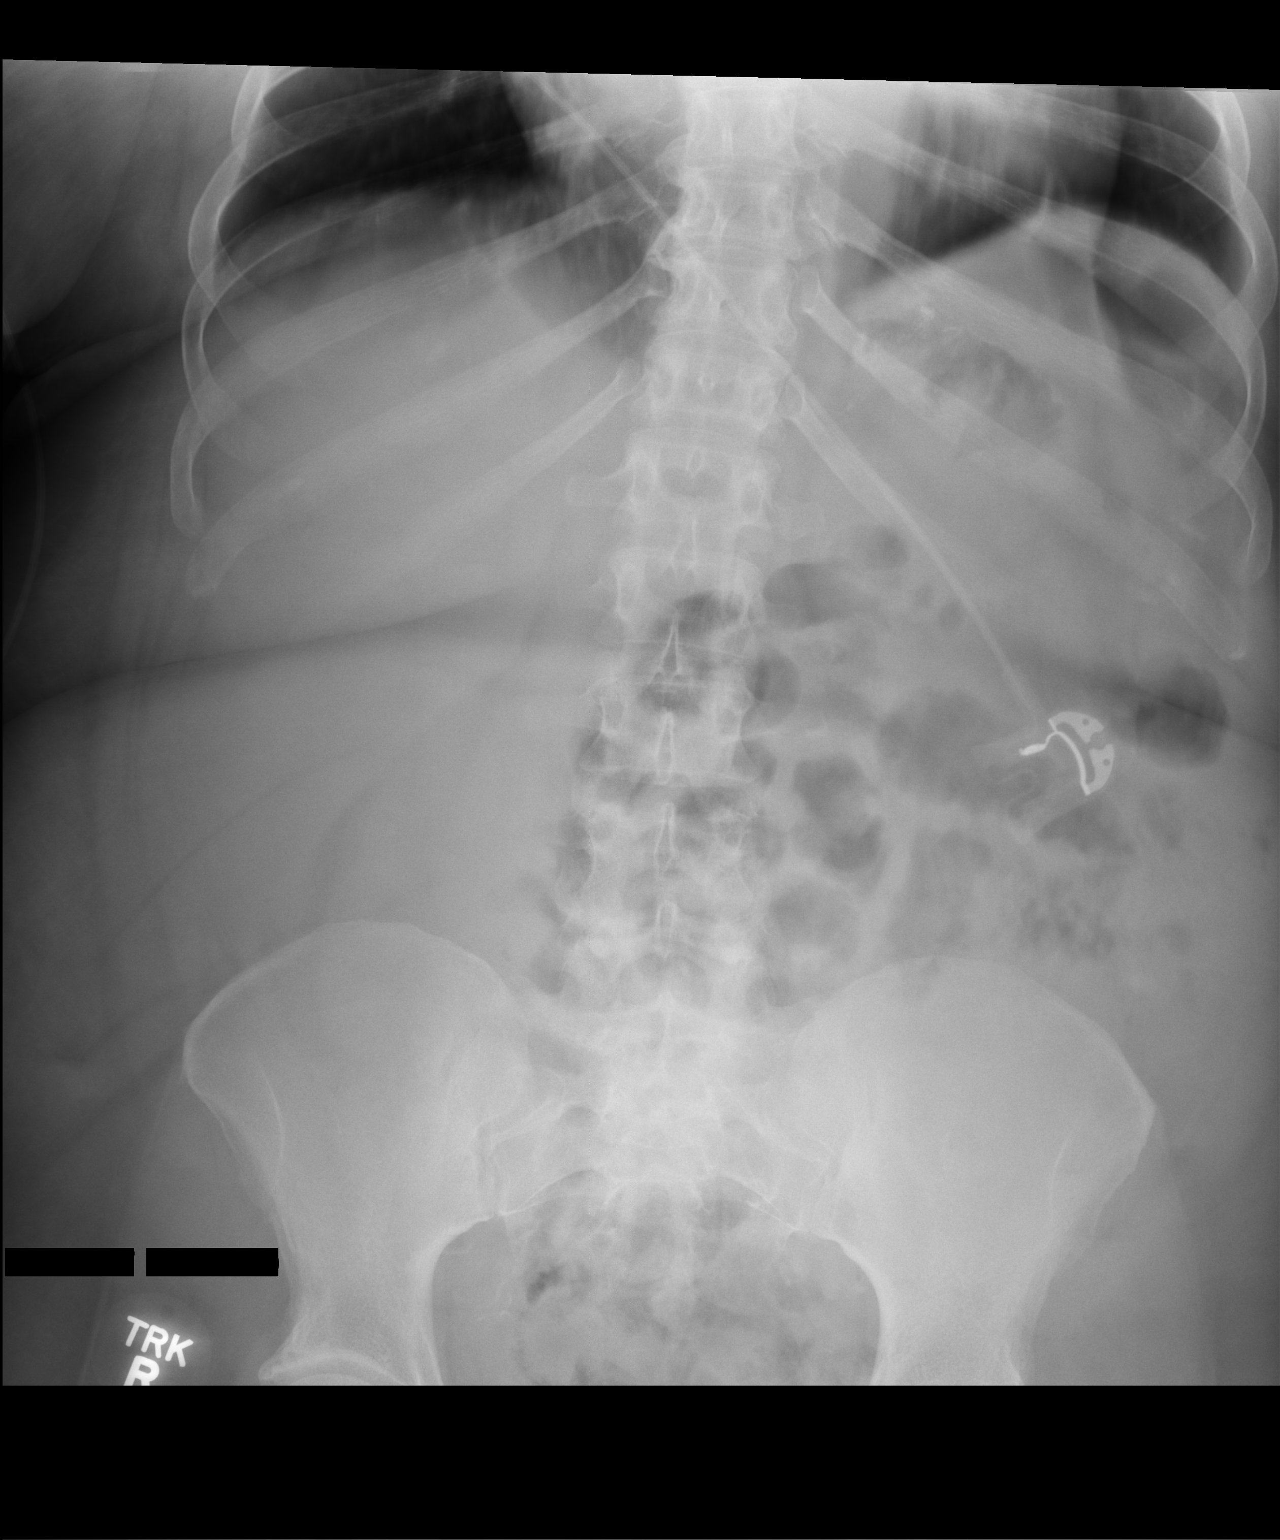

[1 of 1 positions shown; findings below may reference images not displayed]

FINDINGS: The bowel gas pattern is normal. No radio-opaque calculi or other
significant radiographic abnormality are seen.
IMPRESSION: Negative.

## 2020-08-08 DEATH — deceased
